# Patient Record
Sex: Female | Born: 1990 | Race: Black or African American | Hispanic: No | Smoking: Former smoker
Health system: Southern US, Community
[De-identification: ages and names within clinical notes are randomized; demographics above are authoritative.]

## PROBLEM LIST (undated history)

## (undated) DIAGNOSIS — F419 Anxiety disorder, unspecified: Secondary | ICD-10-CM

## (undated) DIAGNOSIS — J45909 Unspecified asthma, uncomplicated: Secondary | ICD-10-CM

## (undated) DIAGNOSIS — Z720 Tobacco use: Secondary | ICD-10-CM

## (undated) HISTORY — PX: NO PAST SURGERIES: SHX2092

---
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
## 2012-12-08 ENCOUNTER — Emergency Department: Payer: Self-pay | Admitting: Emergency Medicine

## 2013-05-26 ENCOUNTER — Emergency Department: Payer: Self-pay | Admitting: Emergency Medicine

## 2013-05-27 LAB — LIPASE, BLOOD: Lipase: 61 U/L — ABNORMAL LOW (ref 73–393)

## 2013-05-27 LAB — PREGNANCY, URINE: Pregnancy Test, Urine: NEGATIVE m[IU]/mL

## 2013-05-27 LAB — CBC
HCT: 38.4 % (ref 35.0–47.0)
HGB: 13 g/dL (ref 12.0–16.0)
RBC: 4.41 10*6/uL (ref 3.80–5.20)
WBC: 7.5 10*3/uL (ref 3.6–11.0)

## 2013-05-27 LAB — COMPREHENSIVE METABOLIC PANEL
BUN: 11 mg/dL (ref 7–18)
Calcium, Total: 8.9 mg/dL — ABNORMAL LOW (ref 9.0–10.7)
Chloride: 106 mmol/L (ref 98–107)
EGFR (African American): >60
EGFR (Non-African Amer.): >60
Osmolality: 274 (ref 275–301)
SGOT(AST): 14 U/L (ref 0–26)

## 2013-05-27 LAB — URINALYSIS, COMPLETE
Bilirubin,UR: NEGATIVE
Protein: 30
Specific Gravity: 1.032 (ref 1.003–1.030)
Squamous Epithelial: 15
WBC UR: 65 /HPF (ref 0–5)

## 2013-06-22 ENCOUNTER — Emergency Department: Payer: Self-pay | Admitting: Emergency Medicine

## 2015-02-14 ENCOUNTER — Emergency Department (HOSPITAL_COMMUNITY)
Admission: EM | Admit: 2015-02-14 | Discharge: 2015-02-14 | Disposition: A | Discharge status: Home / Self Care | Payer: Medicaid Other | Attending: Emergency Medicine | Admitting: Emergency Medicine

## 2015-02-14 ENCOUNTER — Encounter (HOSPITAL_COMMUNITY): Payer: Self-pay

## 2015-02-14 ENCOUNTER — Emergency Department (HOSPITAL_COMMUNITY): Payer: Medicaid Other

## 2015-02-14 DIAGNOSIS — Z720 Tobacco use: Secondary | ICD-10-CM | POA: Insufficient documentation

## 2015-02-14 DIAGNOSIS — R109 Unspecified abdominal pain: Secondary | ICD-10-CM

## 2015-02-14 DIAGNOSIS — N898 Other specified noninflammatory disorders of vagina: Secondary | ICD-10-CM | POA: Diagnosis not present

## 2015-02-14 DIAGNOSIS — Z113 Encounter for screening for infections with a predominantly sexual mode of transmission: Secondary | ICD-10-CM | POA: Diagnosis not present

## 2015-02-14 LAB — COMPREHENSIVE METABOLIC PANEL
ALT: 14 U/L (ref 0–35)
ANION GAP: 6 (ref 5–15)
AST: 16 U/L (ref 0–37)
Albumin: 3.9 g/dL (ref 3.5–5.2)
Alkaline Phosphatase: 54 U/L (ref 39–117)
BILIRUBIN TOTAL: 0.3 mg/dL (ref 0.3–1.2)
BUN: 5 mg/dL — AB (ref 6–23)
CALCIUM: 8.9 mg/dL (ref 8.4–10.5)
CO2: 25 mmol/L (ref 19–32)
CREATININE: 0.88 mg/dL (ref 0.50–1.10)
Chloride: 106 mmol/L (ref 96–112)
Glucose, Bld: 101 mg/dL — ABNORMAL HIGH (ref 70–99)
Potassium: 3.6 mmol/L (ref 3.5–5.1)
SODIUM: 137 mmol/L (ref 135–145)
Total Protein: 7.2 g/dL (ref 6.0–8.3)

## 2015-02-14 LAB — CBC WITH DIFFERENTIAL/PLATELET
BASOS PCT: 1 % (ref 0–1)
Basophils Absolute: 0 10*3/uL (ref 0.0–0.1)
EOS ABS: 0 10*3/uL (ref 0.0–0.7)
Eosinophils Relative: 0 % (ref 0–5)
HCT: 39.6 % (ref 36.0–46.0)
HEMOGLOBIN: 13.2 g/dL (ref 12.0–15.0)
Lymphocytes Relative: 24 % (ref 12–46)
Lymphs Abs: 1.3 10*3/uL (ref 0.7–4.0)
MCH: 28.3 pg (ref 26.0–34.0)
MCHC: 33.3 g/dL (ref 30.0–36.0)
MCV: 84.8 fL (ref 78.0–100.0)
MONO ABS: 0.6 10*3/uL (ref 0.1–1.0)
MONOS PCT: 11 % (ref 3–12)
NEUTROS PCT: 64 % (ref 43–77)
Neutro Abs: 3.5 10*3/uL (ref 1.7–7.7)
PLATELETS: 160 10*3/uL (ref 150–400)
RBC: 4.67 MIL/uL (ref 3.87–5.11)
RDW: 13.1 % (ref 11.5–15.5)
WBC: 5.3 10*3/uL (ref 4.0–10.5)

## 2015-02-14 LAB — URINALYSIS, ROUTINE W REFLEX MICROSCOPIC
GLUCOSE, UA: NEGATIVE mg/dL
Hgb urine dipstick: NEGATIVE
Ketones, ur: 15 mg/dL — AB
LEUKOCYTES UA: NEGATIVE
NITRITE: NEGATIVE
PROTEIN: 30 mg/dL — AB
Specific Gravity, Urine: 1.031 — ABNORMAL HIGH (ref 1.005–1.030)
UROBILINOGEN UA: 0.2 mg/dL (ref 0.0–1.0)
pH: 6 (ref 5.0–8.0)

## 2015-02-14 LAB — WET PREP, GENITAL
TRICH WET PREP: NONE SEEN
YEAST WET PREP: NONE SEEN

## 2015-02-14 LAB — URINE MICROSCOPIC-ADD ON

## 2015-02-14 LAB — I-STAT BETA HCG BLOOD, ED (MC, WL, AP ONLY): I-stat hCG, quantitative: <5 m[IU]/mL (ref <5)

## 2015-02-14 MED ORDER — CEFTRIAXONE SODIUM 250 MG IJ SOLR
250.0000 mg | Freq: Once | INTRAMUSCULAR | Status: AC
  Administered 2015-02-14: 250 mg via INTRAMUSCULAR
  Filled 2015-02-14: qty 250

## 2015-02-14 MED ORDER — DOXYCYCLINE HYCLATE 100 MG PO CAPS: 100.0000 mg | ORAL_CAPSULE | Freq: Two times a day (BID) | ORAL | Status: DC

## 2015-02-14 MED ORDER — AZITHROMYCIN 250 MG PO TABS
1000.0000 mg | ORAL_TABLET | Freq: Once | ORAL | Status: AC
  Administered 2015-02-14: 1000 mg via ORAL
  Filled 2015-02-14: qty 4

## 2015-02-14 MED ORDER — STERILE WATER FOR INJECTION IJ SOLN
INTRAMUSCULAR | Status: AC
  Filled 2015-02-14: qty 10

## 2015-02-14 NOTE — ED Notes (Signed)
Pt brought to room, pt undressed, in gown, on continuous pulse oximetry and blood pressure cuff; warm blanket given

## 2015-02-14 NOTE — Discharge Instructions (Signed)
Abdominal Pain, Women °Abdominal (stomach, pelvic, or belly) pain can be caused by many things. It is important to tell your doctor: °· The location of the pain. °· Does it come and go or is it present all the time? °· Are there things that start the pain (eating certain foods, exercise)? °· Are there other symptoms associated with the pain (fever, nausea, vomiting, diarrhea)? °All of this is helpful to know when trying to find the cause of the pain. °CAUSES  °· Stomach: virus or bacteria infection, or ulcer. °· Intestine: appendicitis (inflamed appendix), regional ileitis (Crohn's disease), ulcerative colitis (inflamed colon), irritable bowel syndrome, diverticulitis (inflamed diverticulum of the colon), or cancer of the stomach or intestine. °· Gallbladder disease or stones in the gallbladder. °· Kidney disease, kidney stones, or infection. °· Pancreas infection or cancer. °· Fibromyalgia (pain disorder). °· Diseases of the female organs: °¨ Uterus: fibroid (non-cancerous) tumors or infection. °¨ Fallopian tubes: infection or tubal pregnancy. °¨ Ovary: cysts or tumors. °¨ Pelvic adhesions (scar tissue). °¨ Endometriosis (uterus lining tissue growing in the pelvis and on the pelvic organs). °¨ Pelvic congestion syndrome (female organs filling up with blood just before the menstrual period). °¨ Pain with the menstrual period. °¨ Pain with ovulation (producing an egg). °¨ Pain with an IUD (intrauterine device, birth control) in the uterus. °¨ Cancer of the female organs. °· Functional pain (pain not caused by a disease, may improve without treatment). °· Psychological pain. °· Depression. °DIAGNOSIS  °Your doctor will decide the seriousness of your pain by doing an examination. °· Blood tests. °· X-rays. °· Ultrasound. °· CT scan (computed tomography, special type of X-ray). °· MRI (magnetic resonance imaging). °· Cultures, for infection. °· Barium enema (dye inserted in the large intestine, to better view it with  X-rays). °· Colonoscopy (looking in intestine with a lighted tube). °· Laparoscopy (minor surgery, looking in abdomen with a lighted tube). °· Major abdominal exploratory surgery (looking in abdomen with a large incision). °TREATMENT  °The treatment will depend on the cause of the pain.  °· Many cases can be observed and treated at home. °· Over-the-counter medicines recommended by your caregiver. °· Prescription medicine. °· Antibiotics, for infection. °· Birth control pills, for painful periods or for ovulation pain. °· Hormone treatment, for endometriosis. °· Nerve blocking injections. °· Physical therapy. °· Antidepressants. °· Counseling with a psychologist or psychiatrist. °· Minor or major surgery. °HOME CARE INSTRUCTIONS  °· Do not take laxatives, unless directed by your caregiver. °· Take over-the-counter pain medicine only if ordered by your caregiver. Do not take aspirin because it can cause an upset stomach or bleeding. °· Try a clear liquid diet (broth or water) as ordered by your caregiver. Slowly move to a bland diet, as tolerated, if the pain is related to the stomach or intestine. °· Have a thermometer and take your temperature several times a day, and record it. °· Bed rest and sleep, if it helps the pain. °· Avoid sexual intercourse, if it causes pain. °· Avoid stressful situations. °· Keep your follow-up appointments and tests, as your caregiver orders. °· If the pain does not go away with medicine or surgery, you may try: °¨ Acupuncture. °¨ Relaxation exercises (yoga, meditation). °¨ Group therapy. °¨ Counseling. °SEEK MEDICAL CARE IF:  °· You notice certain foods cause stomach pain. °· Your home care treatment is not helping your pain. °· You need stronger pain medicine. °· You want your IUD removed. °· You feel faint or   lightheaded. °· You develop nausea and vomiting. °· You develop a rash. °· You are having side effects or an allergy to your medicine. °SEEK IMMEDIATE MEDICAL CARE IF:  °· Your  pain does not go away or gets worse. °· You have a fever. °· Your pain is felt only in portions of the abdomen. The right side could possibly be appendicitis. The left lower portion of the abdomen could be colitis or diverticulitis. °· You are passing blood in your stools (bright red or black tarry stools, with or without vomiting). °· You have blood in your urine. °· You develop chills, with or without a fever. °· You pass out. °MAKE SURE YOU:  °· Understand these instructions. °· Will watch your condition. °· Will get help right away if you are not doing well or get worse. °Document Released: 10/06/2007 Document Revised: 04/25/2014 Document Reviewed: 10/26/2009 °[employer]® Patient Information ©2015 [employer]. This information is not intended to replace advice given to you by your health care provider. Make sure you discuss any questions you have with your health care provider. ° °

## 2015-02-14 NOTE — ED Notes (Signed)
Pt here for lower back pain and lower abd pain for the past week. Feels nauseated, no vomiting or diarrhea. Denies any urinary sx or vaginal discharge.

## 2015-02-14 NOTE — ED Notes (Signed)
Patient transported to [facility] 

## 2015-02-14 NOTE — ED Notes (Signed)
Pt ambulatory on discharge. Pt denies any reaction to meds. Pt refused wheelchair. Pt given a soda on discharge

## 2015-02-14 NOTE — ED Notes (Signed)
Patient was given a [place] sandwich bag with a coke with ice.

## 2015-02-14 NOTE — ED Provider Notes (Signed)
CSN: [id number]     Arrival date & time 02/14/15  [id number] History   First MD Initiated Contact with Patient 02/14/15 [phone]     Chief Complaint  Patient presents with  . Abdominal Pain     (Consider location/radiation/quality/duration/timing/severity/associated sxs/prior Treatment) HPI Comments: Pt c/o generalized lower abdominal pain and lower back pain times one week. Denies fever. States that she has had some discharge and urinary frequency. History of std several years ago. No vomiting, diarrhea. Denies history of ovarian cyst. lmp was 2/7. Has been taking tylenol for pain with mild relief. Vaginal discharge is white.  The history is provided by the patient. No language interpreter was used.    History reviewed. No pertinent past medical history. History reviewed. No pertinent past surgical history. History reviewed. No pertinent family history. History  Substance Use Topics  . Smoking status: Current Every Day Smoker  . Smokeless tobacco: Not on file  . Alcohol Use: Yes   OB History    No data available     Review of Systems  All other systems reviewed and are negative.     Allergies  Review of patient's allergies indicates no known allergies.  Home Medications   Prior to Admission medications   Medication Sig Start Date End Date Taking? Authorizing Provider  acetaminophen (TYLENOL) 325 MG tablet Take 650 mg by mouth every 6 (six) hours as needed for mild pain or headache.   Yes Historical Provider, MD   BP 134/95 mmHg  Pulse 105  Temp(Src) 98.6 F (37 C) (Oral)  Resp 18  SpO2 98%  LMP 02/01/2015 Physical Exam  Constitutional: She is oriented to person, place, and time. She appears well-developed and well-nourished.  Cardiovascular: Normal rate and regular rhythm.   Pulmonary/Chest: Effort normal and breath sounds normal.  Abdominal: Soft. Bowel sounds are normal. There is tenderness.  Genitourinary:  Clear jelly like discharge. Bilateral adnexal tednerness   Musculoskeletal: Normal range of motion.  Neurological: She is alert and oriented to person, place, and time.  Skin: Skin is warm and dry.  Psychiatric: She has a normal mood and affect.  Nursing note and vitals reviewed.   ED Course  Procedures (including critical care time) Labs Review Labs Reviewed  WET PREP, GENITAL - Abnormal; Notable for the following:    Clue Cells Wet Prep HPF POC FEW (*)    WBC, Wet Prep HPF POC MANY (*)    All other components within normal limits  COMPREHENSIVE METABOLIC PANEL - Abnormal; Notable for the following:    Glucose, Bld 101 (*)    BUN 5 (*)    All other components within normal limits  URINALYSIS, ROUTINE W REFLEX MICROSCOPIC - Abnormal; Notable for the following:    Color, Urine AMBER (*)    Specific Gravity, Urine 1.031 (*)    Bilirubin Urine SMALL (*)    Ketones, ur 15 (*)    Protein, ur 30 (*)    All other components within normal limits  CBC WITH DIFFERENTIAL/PLATELET  URINE MICROSCOPIC-ADD ON  HIV ANTIBODY (ROUTINE TESTING)  RPR  I-STAT BETA HCG BLOOD, ED ([facility], [facility], [facility] ONLY)  GC/CHLAMYDIA PROBE AMP (Protivin)    Imaging Review [place] Transvaginal Non-ob  02/14/2015   CLINICAL DATA:  Mid pelvic pain for 1 week, possible tubo-ovarian abscess  EXAM: TRANSABDOMINAL AND TRANSVAGINAL ULTRASOUND OF PELVIS  TECHNIQUE: Both transabdominal and transvaginal ultrasound examinations of the pelvis were performed. Transabdominal technique was performed for global imaging of the pelvis including uterus,  ovaries, adnexal regions, and pelvic cul-de-sac. It was necessary to proceed with endovaginal exam following the transabdominal exam to visualize the the ovaries.  COMPARISON:  None  FINDINGS: Uterus  Measurements: 8.7 x 3.6 x 4 cm. No fibroids or other mass visualized.  Endometrium  Thickness: 3.8 mm in thickness within normal limits. No focal abnormality visualized.  Right ovary  Measurements: 4.3 x 1.9 x 1.9 cm. Small follicles are noted. A collapsing  follicle measures 1.3 cm. No adnexal mass.  Left ovary  Measurements: 4.0 x 2 x 2 cm. Normal appearance/no adnexal mass. Normal appearing follicles.  Other findings  No free fluid.  IMPRESSION: 1. Unremarkable uterus.  No pelvic free fluid. 2. No adnexal mass. Normal appearing ovaries. Probable collapsing follicle within right ovary measures 1.3 cm. No pelvic free fluid.   Electronically Signed   By: Natasha Mead M.D.   On: 02/14/2015 10:28   US Pelvis Complete  02/14/2015   CLINICAL DATA:  Mid pelvic pain for 1 week, possible tubo-ovarian abscess  EXAM: TRANSABDOMINAL AND TRANSVAGINAL ULTRASOUND OF PELVIS  TECHNIQUE: Both transabdominal and transvaginal ultrasound examinations of the pelvis were performed. Transabdominal technique was performed for global imaging of the pelvis including uterus, ovaries, adnexal regions, and pelvic cul-de-sac. It was necessary to proceed with endovaginal exam following the transabdominal exam to visualize the the ovaries.  COMPARISON:  None  FINDINGS: Uterus  Measurements: 8.7 x 3.6 x 4 cm. No fibroids or other mass visualized.  Endometrium  Thickness: 3.8 mm in thickness within normal limits. No focal abnormality visualized.  Right ovary  Measurements: 4.3 x 1.9 x 1.9 cm. Small follicles are noted. A collapsing follicle measures 1.3 cm. No adnexal mass.  Left ovary  Measurements: 4.0 x 2 x 2 cm. Normal appearance/no adnexal mass. Normal appearing follicles.  Other findings  No free fluid.  IMPRESSION: 1. Unremarkable uterus.  No pelvic free fluid. 2. No adnexal mass. Normal appearing ovaries. Probable collapsing follicle within right ovary measures 1.3 cm. No pelvic free fluid.   Electronically Signed   By: Natasha Mead M.D.   On: 02/14/2015 10:28     EKG Interpretation None      MDM   Final diagnoses:  Abdominal pain  Vaginal discharge  Screen for STD (sexually transmitted disease)    Pt treated for std based on exam. Discussed return precautions. No sign of toa.  Will send home on doxy. No infection    Teressa Lower, NP 02/14/15 1110  Gerhard Munch, MD 02/14/15 1115

## 2015-02-14 NOTE — ED Notes (Signed)
Pelvic cart set up in room 

## 2015-02-15 LAB — RPR: RPR Ser Ql: NONREACTIVE

## 2015-02-15 LAB — GC/CHLAMYDIA PROBE AMP (~~LOC~~) NOT AT ARMC
CHLAMYDIA, DNA PROBE: NEGATIVE
NEISSERIA GONORRHEA: NEGATIVE

## 2015-02-15 LAB — HIV ANTIBODY (ROUTINE TESTING W REFLEX): HIV Screen 4th Generation wRfx: NONREACTIVE

## 2015-04-05 ENCOUNTER — Emergency Department (HOSPITAL_COMMUNITY)
Admission: EM | Admit: 2015-04-05 | Discharge: 2015-04-05 | Disposition: A | Discharge status: Home / Self Care | Payer: Medicaid Other | Attending: Emergency Medicine | Admitting: Emergency Medicine

## 2015-04-05 ENCOUNTER — Encounter (HOSPITAL_COMMUNITY): Payer: Self-pay | Admitting: *Deleted

## 2015-04-05 ENCOUNTER — Emergency Department (HOSPITAL_COMMUNITY): Payer: Medicaid Other

## 2015-04-05 DIAGNOSIS — Y9389 Activity, other specified: Secondary | ICD-10-CM | POA: Insufficient documentation

## 2015-04-05 DIAGNOSIS — Y929 Unspecified place or not applicable: Secondary | ICD-10-CM | POA: Diagnosis not present

## 2015-04-05 DIAGNOSIS — W07XXXA Fall from chair, initial encounter: Secondary | ICD-10-CM | POA: Diagnosis not present

## 2015-04-05 DIAGNOSIS — S4991XA Unspecified injury of right shoulder and upper arm, initial encounter: Secondary | ICD-10-CM | POA: Diagnosis present

## 2015-04-05 DIAGNOSIS — S40011A Contusion of right shoulder, initial encounter: Secondary | ICD-10-CM | POA: Insufficient documentation

## 2015-04-05 DIAGNOSIS — I10 Essential (primary) hypertension: Secondary | ICD-10-CM | POA: Diagnosis not present

## 2015-04-05 DIAGNOSIS — Y998 Other external cause status: Secondary | ICD-10-CM | POA: Insufficient documentation

## 2015-04-05 DIAGNOSIS — Z720 Tobacco use: Secondary | ICD-10-CM | POA: Insufficient documentation

## 2015-04-05 DIAGNOSIS — Z792 Long term (current) use of antibiotics: Secondary | ICD-10-CM | POA: Diagnosis not present

## 2015-04-05 MED ORDER — DIAZEPAM 5 MG PO TABS
5.0000 mg | ORAL_TABLET | Freq: Once | ORAL | Status: AC
  Administered 2015-04-05: 5 mg via ORAL
  Filled 2015-04-05: qty 1

## 2015-04-05 MED ORDER — DICLOFENAC SODIUM 1 % TD GEL: 2.0000 g | Freq: Four times a day (QID) | TRANSDERMAL | Status: DC

## 2015-04-05 MED ORDER — OXYCODONE-ACETAMINOPHEN 5-325 MG PO TABS
2.0000 | ORAL_TABLET | Freq: Once | ORAL | Status: AC
  Administered 2015-04-05: 2 via ORAL
  Filled 2015-04-05: qty 2

## 2015-04-05 MED ORDER — OXYCODONE-ACETAMINOPHEN 5-325 MG PO TABS: 1.0000 | ORAL_TABLET | Freq: Four times a day (QID) | ORAL | Status: DC | PRN

## 2015-04-05 MED ORDER — DIAZEPAM 5 MG PO TABS
5.0000 mg | ORAL_TABLET | Freq: Two times a day (BID) | ORAL | Status: DC
Start: 2015-04-05 — End: 2022-03-05

## 2015-04-05 NOTE — ED Notes (Signed)
The pt is c/o rt shoulder pain.  She fell last night and did not have a way here.  She has pain over her rt a-c joint painful when moving her rt arm and she cannot lift the arm very high.  lmp last week

## 2015-04-05 NOTE — Discharge Instructions (Signed)
Recommend that you alternate ice and heat 3-4 times per day. YOu should also stretch your shoulder out 3-4 times per day. Apply Voltaren gel to your shoulder as prescribed and take Valium for spasm. You may take Percocet as needed for severe pain. Follow-up with orthopedics if symptoms persist.  Rotator Cuff Injury Rotator cuff injury is any type of injury to the set of muscles and tendons that make up the stabilizing unit of your shoulder. This unit holds the ball of your upper arm bone (humerus) in the socket of your shoulder blade (scapula).  CAUSES Injuries to your rotator cuff most commonly come from sports or activities that cause your arm to be moved repeatedly over your head. Examples of this include throwing, weight lifting, swimming, or racquet sports. Long lasting (chronic) irritation of your rotator cuff can cause soreness and swelling (inflammation), bursitis, and eventual damage to your tendons, such as a tear (rupture). SIGNS AND SYMPTOMS Acute rotator cuff tear:  Sudden tearing sensation followed by severe pain shooting from your upper shoulder down your arm toward your elbow.  Decreased range of motion of your shoulder because of pain and muscle spasm.  Severe pain.  Inability to raise your arm out to the side because of pain and loss of muscle power (large tears). Chronic rotator cuff tear:  Pain that usually is worse at night and may interfere with sleep.  Gradual weakness and decreased shoulder motion as the pain worsens.  Decreased range of motion. Rotator cuff tendinitis:  Deep ache in your shoulder and the outside upper arm over your shoulder.  Pain that comes on gradually and becomes worse when lifting your arm to the side or turning it inward. DIAGNOSIS Rotator cuff injury is diagnosed through a medical history, physical exam, and imaging exam. The medical history helps determine the type of rotator cuff injury. Your health care provider will look at your  injured shoulder, feel the injured area, and ask you to move your shoulder in different positions. X-ray exams typically are done to rule out other causes of shoulder pain, such as fractures. MRI is the exam of choice for the most severe shoulder injuries because the images show muscles and tendons.  TREATMENT  Chronic tear:  Medicine for pain, such as acetaminophen or ibuprofen.  Physical therapy and range-of-motion exercises may be helpful in maintaining shoulder function and strength.  Steroid injections into your shoulder joint.  Surgical repair of the rotator cuff if the injury does not heal with noninvasive treatment. Acute tear:  Anti-inflammatory medicines such as ibuprofen and naproxen to help reduce pain and swelling.  A sling to help support your arm and rest your rotator cuff muscles. Long-term use of a sling is not advised. It may cause significant stiffening of the shoulder joint.  Surgery may be considered within a few weeks, especially in younger, active people, to return the shoulder to full function.  Indications for surgical treatment include the following:  Age younger than 60 years.  Rotator cuff tears that are complete.  Physical therapy, rest, and anti-inflammatory medicines have been used for 6-8 weeks, with no improvement.  Employment or sporting activity that requires constant shoulder use. Tendinitis:  Anti-inflammatory medicines such as ibuprofen and naproxen to help reduce pain and swelling.  A sling to help support your arm and rest your rotator cuff muscles. Long-term use of a sling is not advised. It may cause significant stiffening of the shoulder joint.  Severe tendinitis may require:  Steroid injections into  your shoulder joint.  Physical therapy.  Surgery. HOME CARE INSTRUCTIONS   Apply ice to your injury:  Put ice in a plastic bag.  Place a towel between your skin and the bag.  Leave the ice on for 20 minutes, 2-3 times a  day.  If you have a shoulder immobilizer (sling and straps), wear it until told otherwise by your health care provider.  You may want to sleep on several pillows or in a recliner at night to lessen swelling and pain.  Only take over-the-counter or prescription medicines for pain, discomfort, or fever as directed by your health care provider.  Do simple hand squeezing exercises with a soft rubber ball to decrease hand swelling. SEEK MEDICAL CARE IF:   Your shoulder pain increases, or new pain or numbness develops in your arm, hand, or fingers.  Your hand or fingers are colder than your other hand. SEEK IMMEDIATE MEDICAL CARE IF:   Your arm, hand, or fingers are numb or tingling.  Your arm, hand, or fingers are increasingly swollen and painful, or they turn white or blue. MAKE SURE YOU:  Understand these instructions.  Will watch your condition.  Will get help right away if you are not doing well or get worse. Document Released: 12/06/2000 Document Revised: 12/14/2013 Document Reviewed: 07/21/2013 [facility] Patient Information 2015 [place], [place]. This information is not intended to replace advice given to you by your health care provider. Make sure you discuss any questions you have with your health care provider.

## 2015-04-05 NOTE — ED Provider Notes (Signed)
CSN: [id number]     Arrival date & time 04/05/15  2009 History   First MD Initiated Contact with Patient 04/05/15 2032     Chief Complaint  Patient presents with  . Fall    (Consider location/radiation/quality/duration/timing/severity/associated sxs/prior Treatment) HPI Comments: Patient is a 24 year old female witha past medical history who presents to the emergency department for further evaluation of right shoulder pain. Patient states that she was standing on a chair trying to change a light bulb when she lost her balance and fell on her right shoulder. She states that she has been having constant pain to the superior aspect of her shoulder which is worse with movement. She denies taking any medications for her symptoms or applying ice. She denies any loss of sensation in her right arm. No shortness of breath. Patient denies head trauma or loss of consciousness. No history of prior injury to her right shoulder.  Patient is a 24 y.o. female presenting with fall. The history is provided by the patient. No language interpreter was used.  Fall Associated symptoms include arthralgias and myalgias.    Past Medical History  Diagnosis Date  . Hypertension    History reviewed. No pertinent past surgical history. No family history on file. History  Substance Use Topics  . Smoking status: Current Every Day Smoker  . Smokeless tobacco: Not on file  . Alcohol Use: Yes   OB History    No data available      Review of Systems  Musculoskeletal: Positive for myalgias and arthralgias.  All other systems reviewed and are negative.   Allergies  Review of patient's allergies indicates no known allergies.  Home Medications   Prior to Admission medications   Medication Sig Start Date End Date Taking? Authorizing Provider  acetaminophen (TYLENOL) 325 MG tablet Take 650 mg by mouth every 6 (six) hours as needed for mild pain or headache.    Historical Provider, MD  diazepam (VALIUM) 5 MG  tablet Take 1 tablet (5 mg total) by mouth 2 (two) times daily. 04/05/15   Antony Madura, PA-C  diclofenac sodium (VOLTAREN) 1 % GEL Apply 2 g topically 4 (four) times daily. 04/05/15   Antony Madura, PA-C  doxycycline (VIBRAMYCIN) 100 MG capsule Take 1 capsule (100 mg total) by mouth 2 (two) times daily. 02/14/15   Teressa Lower, NP  oxyCODONE-acetaminophen (PERCOCET/ROXICET) 5-325 MG per tablet Take 1-2 tablets by mouth every 6 (six) hours as needed for severe pain. 04/05/15   Antony Madura, PA-C   BP 113/84 mmHg  Pulse 73  Temp(Src) 97.7 F (36.5 C) (Oral)  Resp 14  Ht  (1.549 m)  Wt 147 lb (66.679 kg)  BMI 27.79 kg/m2  SpO2 100%  LMP 03/05/2015   Physical Exam  Constitutional: She is oriented to person, place, and time. She appears well-developed and well-nourished. No distress.  Nontoxic/nonseptic appearing  HENT:  Head: Normocephalic and atraumatic.  Eyes: Conjunctivae and EOM are normal. No scleral icterus.  Neck: Normal range of motion.  Cardiovascular: Normal rate, regular rhythm and intact distal pulses.   Distal radial pulse 2+ in the right upper extremity  Pulmonary/Chest: Effort normal. No respiratory distress.  Musculoskeletal: She exhibits tenderness.       Right shoulder: She exhibits decreased range of motion, tenderness and pain. She exhibits no bony tenderness, no swelling, no effusion, no crepitus, no deformity, no spasm and normal pulse.       Arms: TTP to superior R shoulder and the right [facility]  joint. Decreased AROM appreciated to be secondary to pain. Strength against resistance 4/5, also likely due to pain. No bony deformity or crepitus. No evidence of dislocation.  Neurological: She is alert and oriented to person, place, and time. She exhibits normal muscle tone. Coordination normal.  GCS 15. Sensation to light touch intact. Good grip strength.  Skin: Skin is warm and dry. No rash noted. She is not diaphoretic. No erythema. No pallor.  Psychiatric: She has a  normal mood and affect. Her behavior is normal.  Nursing note and vitals reviewed.   ED Course  Procedures (including critical care time) Labs Review Labs Reviewed - No data to display  Imaging Review Dg Shoulder Right  04/05/2015   CLINICAL DATA:  Pain following fall  EXAM: RIGHT SHOULDER - 2+ VIEW  COMPARISON:  None.  FINDINGS: Frontal, Y scapular, and axillary images were obtained. There is no fracture or dislocation. Joint spaces appear intact. No erosive change.  IMPRESSION: No fracture or dislocation.  No appreciable arthropathy.   Electronically Signed   By: Bretta BangWilliam  Woodruff III M.D.   On: 04/05/2015 21:08     EKG Interpretation None      MDM   Final diagnoses:  Shoulder contusion, right, initial encounter    24 year old female percent to the emergency department for further evaluation of right shoulder pain. Workup and physical exam consistent with contusion to the right shoulder. X-ray negative for fracture, dislocation, or deformity. Patient given a sling for comfort. Have advised frequent stretching as well as these of NSAIDs and muscle relaxers. Orthopedic referral given should symptoms persist or worsen. Patient agreeable to plan with no unaddressed concerns. Patient discharged in good condition.   Filed Vitals:   04/05/15 2014 04/05/15 2126  BP: 123/77 113/84  Pulse: 81 73  Temp: 98.6 F (37 C) 97.7 F (36.5 C)  TempSrc:  Oral  Resp: 16 14  Height: 5\' 1"  (1.549 m)   Weight: 147 lb (66.679 kg)   SpO2: 98% 100%       Antony MaduraKelly Alexandre Lightsey, PA-C 04/05/15 2333  Cathren LaineKevin Steinl, MD 04/05/15 [phone]

## 2015-04-06 ENCOUNTER — Encounter (HOSPITAL_BASED_OUTPATIENT_CLINIC_OR_DEPARTMENT_OTHER): Payer: Self-pay | Admitting: Emergency Medicine

## 2015-06-18 ENCOUNTER — Encounter (HOSPITAL_COMMUNITY): Payer: Self-pay | Admitting: Emergency Medicine

## 2015-06-18 ENCOUNTER — Emergency Department (HOSPITAL_COMMUNITY)
Admission: EM | Admit: 2015-06-18 | Discharge: 2015-06-18 | Disposition: A | Discharge status: Home / Self Care | Payer: Self-pay | Attending: Emergency Medicine | Admitting: Emergency Medicine

## 2015-06-18 ENCOUNTER — Emergency Department (HOSPITAL_COMMUNITY): Payer: Self-pay

## 2015-06-18 ENCOUNTER — Emergency Department (HOSPITAL_COMMUNITY): Payer: Medicaid Other

## 2015-06-18 DIAGNOSIS — Z3202 Encounter for pregnancy test, result negative: Secondary | ICD-10-CM | POA: Insufficient documentation

## 2015-06-18 DIAGNOSIS — Z79899 Other long term (current) drug therapy: Secondary | ICD-10-CM | POA: Insufficient documentation

## 2015-06-18 DIAGNOSIS — Z792 Long term (current) use of antibiotics: Secondary | ICD-10-CM | POA: Insufficient documentation

## 2015-06-18 DIAGNOSIS — I10 Essential (primary) hypertension: Secondary | ICD-10-CM | POA: Insufficient documentation

## 2015-06-18 DIAGNOSIS — Z791 Long term (current) use of non-steroidal anti-inflammatories (NSAID): Secondary | ICD-10-CM | POA: Insufficient documentation

## 2015-06-18 DIAGNOSIS — B353 Tinea pedis: Secondary | ICD-10-CM | POA: Insufficient documentation

## 2015-06-18 DIAGNOSIS — Z720 Tobacco use: Secondary | ICD-10-CM | POA: Insufficient documentation

## 2015-06-18 DIAGNOSIS — R079 Chest pain, unspecified: Secondary | ICD-10-CM | POA: Insufficient documentation

## 2015-06-18 LAB — RAPID URINE DRUG SCREEN, HOSP PERFORMED
AMPHETAMINES: NOT DETECTED
Barbiturates: NOT DETECTED
Benzodiazepines: POSITIVE — AB
Cocaine: NOT DETECTED
OPIATES: NOT DETECTED
Tetrahydrocannabinol: POSITIVE — AB

## 2015-06-18 LAB — CBC WITH DIFFERENTIAL/PLATELET
Basophils Absolute: 0 10*3/uL (ref 0.0–0.1)
Basophils Relative: 0 % (ref 0–1)
Eosinophils Absolute: 0 10*3/uL (ref 0.0–0.7)
Eosinophils Relative: 0 % (ref 0–5)
HCT: 39.4 % (ref 36.0–46.0)
HEMOGLOBIN: 13.6 g/dL (ref 12.0–15.0)
LYMPHS ABS: 1.8 10*3/uL (ref 0.7–4.0)
Lymphocytes Relative: 12 % (ref 12–46)
MCH: 29.4 pg (ref 26.0–34.0)
MCHC: 34.5 g/dL (ref 30.0–36.0)
MCV: 85.1 fL (ref 78.0–100.0)
MONO ABS: 1 10*3/uL (ref 0.1–1.0)
Monocytes Relative: 7 % (ref 3–12)
Neutro Abs: 12.3 10*3/uL — ABNORMAL HIGH (ref 1.7–7.7)
Neutrophils Relative %: 81 % — ABNORMAL HIGH (ref 43–77)
PLATELETS: 248 10*3/uL (ref 150–400)
RBC: 4.63 MIL/uL (ref 3.87–5.11)
RDW: 13.5 % (ref 11.5–15.5)
WBC: 15.2 10*3/uL — AB (ref 4.0–10.5)

## 2015-06-18 LAB — I-STAT CHEM 8, ED
BUN: 4 mg/dL — ABNORMAL LOW (ref 6–20)
Calcium, Ion: 1.17 mmol/L (ref 1.12–1.23)
Chloride: 101 mmol/L (ref 101–111)
Creatinine, Ser: 0.7 mg/dL (ref 0.44–1.00)
Glucose, Bld: 105 mg/dL — ABNORMAL HIGH (ref 65–99)
HEMATOCRIT: 46 % (ref 36.0–46.0)
HEMOGLOBIN: 15.6 g/dL — AB (ref 12.0–15.0)
Potassium: 3.8 mmol/L (ref 3.5–5.1)
Sodium: 135 mmol/L (ref 135–145)
TCO2: 22 mmol/L (ref 0–100)

## 2015-06-18 LAB — URINALYSIS, ROUTINE W REFLEX MICROSCOPIC
Bilirubin Urine: NEGATIVE
Glucose, UA: NEGATIVE mg/dL
Hgb urine dipstick: NEGATIVE
KETONES UR: NEGATIVE mg/dL
LEUKOCYTES UA: NEGATIVE
NITRITE: NEGATIVE
PH: 5.5 (ref 5.0–8.0)
PROTEIN: NEGATIVE mg/dL
Specific Gravity, Urine: >1.03 — ABNORMAL HIGH (ref 1.005–1.030)
Urobilinogen, UA: 0.2 mg/dL (ref 0.0–1.0)

## 2015-06-18 LAB — I-STAT TROPONIN, ED: TROPONIN I, POC: 0 ng/mL (ref 0.00–0.08)

## 2015-06-18 LAB — PREGNANCY, URINE: PREG TEST UR: NEGATIVE

## 2015-06-18 LAB — D-DIMER, QUANTITATIVE (NOT AT ARMC): D DIMER QUANT: 1.42 ug{FEU}/mL — AB (ref 0.00–0.48)

## 2015-06-18 MED ORDER — CLOTRIMAZOLE-BETAMETHASONE 1-0.05 % EX CREA: TOPICAL_CREAM | CUTANEOUS | Status: DC

## 2015-06-18 MED ORDER — IOHEXOL 350 MG/ML SOLN
80.0000 mL | Freq: Once | INTRAVENOUS | Status: AC | PRN
  Administered 2015-06-18: 80 mL via INTRAVENOUS

## 2015-06-18 MED ORDER — IBUPROFEN 400 MG PO TABS: 400.0000 mg | ORAL_TABLET | Freq: Three times a day (TID) | ORAL | Status: DC

## 2015-06-18 MED ORDER — KETOROLAC TROMETHAMINE 30 MG/ML IJ SOLN
30.0000 mg | Freq: Once | INTRAMUSCULAR | Status: AC
  Administered 2015-06-18: 30 mg via INTRAVENOUS
  Filled 2015-06-18: qty 1

## 2015-06-18 NOTE — ED Notes (Signed)
Pt reports chest pain x2 days; reports pain on inspiration and "it hurts to breath"; states she cannot lie flat; hx of cocaine abuse. Denies use of cocaine in the last week. Pt a/o x4.

## 2015-06-18 NOTE — ED Provider Notes (Signed)
CSN: [id number]     Arrival date & time 06/18/15  0801 History   First MD Initiated Contact with Patient 06/18/15 0818     Chief Complaint  Patient presents with  . Chest Pain     (Consider location/radiation/quality/duration/timing/severity/associated sxs/prior Treatment) Patient is a 24 y.o. female presenting with chest pain. The history is provided by the patient.  Chest Pain Associated symptoms: shortness of breath   Associated symptoms: no abdominal pain, no back pain, no headache, no nausea, no numbness, not vomiting and no weakness    patient has had shortness of breath for the last 2 days. Also some dull chest pain in her mid chest. States she has lived in the place that had a lot of mold and she thinks that was causing this. States is worse for her to lay down to. She is a smoker. She also has a substance abuser and recently relapsed on cocaine. Reportedly last used several days ago. No fevers. She states she cannot cough. When asked what this means she says nothing can come up with it. No swelling or legs. No known cardiac history. She is 24 years old. No family history of early cardiac disease. She is not injected drugs in the past.  Past Medical History  Diagnosis Date  . Hypertension    History reviewed. No pertinent past surgical history. No family history on file. History  Substance Use Topics  . Smoking status: Current Every Day Smoker -- 0.50 packs/day    Types: Cigarettes  . Smokeless tobacco: Not on file  . Alcohol Use: Yes   OB History    No data available     Review of Systems  Constitutional: Negative for activity change and appetite change.  Eyes: Negative for pain.  Respiratory: Positive for shortness of breath. Negative for chest tightness.   Cardiovascular: Positive for chest pain. Negative for leg swelling.  Gastrointestinal: Negative for nausea, vomiting, abdominal pain and diarrhea.  Genitourinary: Negative for flank pain.  Musculoskeletal:  Negative for back pain and neck stiffness.  Skin: Negative for rash.  Neurological: Negative for weakness, numbness and headaches.  Psychiatric/Behavioral: Negative for behavioral problems.      Allergies  Review of patient's allergies indicates no known allergies.  Home Medications   Prior to Admission medications   Medication Sig Start Date End Date Taking? Authorizing Provider  acetaminophen (TYLENOL) 325 MG tablet Take 650 mg by mouth every 6 (six) hours as needed for mild pain or headache.    Historical Provider, MD  diazepam (VALIUM) 5 MG tablet Take 1 tablet (5 mg total) by mouth 2 (two) times daily. 04/05/15   Antony Madura, PA-C  diclofenac sodium (VOLTAREN) 1 % GEL Apply 2 g topically 4 (four) times daily. 04/05/15   Antony Madura, PA-C  doxycycline (VIBRAMYCIN) 100 MG capsule Take 1 capsule (100 mg total) by mouth 2 (two) times daily. 02/14/15   Teressa Lower, NP  oxyCODONE-acetaminophen (PERCOCET/ROXICET) 5-325 MG per tablet Take 1-2 tablets by mouth every 6 (six) hours as needed for severe pain. 04/05/15   Antony Madura, PA-C   BP 102/60 mmHg  Pulse 93  Temp(Src) 98.1 F (36.7 C) (Oral)  Resp 16  SpO2 98%  LMP 05/17/2015 (Approximate) Physical Exam  Constitutional: She is oriented to person, place, and time. She appears well-developed and well-nourished.  HENT:  Head: Normocephalic and atraumatic.  Neck: Normal range of motion. Neck supple.  Cardiovascular: Normal rate, regular rhythm and normal heart sounds.   No murmur heard.  Pulmonary/Chest: Effort normal and breath sounds normal. No respiratory distress. She has no wheezes. She has no rales. She exhibits tenderness.  Mild tenderness anterior mid chest wall.  Abdominal: Soft. Bowel sounds are normal. She exhibits no distension. There is no tenderness. There is no rebound and no guarding.  Musculoskeletal: Normal range of motion. She exhibits no edema.  Neurological: She is alert and oriented to person, place, and time.  No cranial nerve deficit.  Skin: Skin is warm and dry.  Psychiatric: Her speech is normal.  Patient is anxious  Nursing note and vitals reviewed.   ED Course  Procedures (including critical care time) Labs Review Labs Reviewed  D-DIMER, QUANTITATIVE (NOT AT [facility])  CBC WITH DIFFERENTIAL/PLATELET  PREGNANCY, URINE  URINE RAPID DRUG SCREEN, HOSP PERFORMED  URINALYSIS, ROUTINE W REFLEX MICROSCOPIC (NOT AT [facility])  I-STAT CHEM 8, ED  I-STAT TROPOININ, ED    Imaging Review No results found.   EKG Interpretation   Date/Time:  Sunday June 18 2015 08:09:27 EDT Ventricular Rate:  91 PR Interval:  216 QRS Duration: 84 QT Interval:  346 QTC Calculation: 425 R Axis:   79 Text Interpretation:  Sinus rhythm with 1st degree A-V block ST elevation  consider anterolateral injury or acute infarct ST elevation consider  inferior injury or acute infarct Confirmed by Ramie Palladino  MD, Heywood Tokunaga  [phone]) on 06/18/2015 8:14:21 AM      MDM   Final diagnoses:  None   patient with chest pain. Pleuritic and elevated d-dimer. Negative Doppler. Does have somewhat abnormal EKG without an old to compare to. Consideration also pericarditis. Was started on NSAIDs will discharge home. Upon discharge patient states she had some bad fungus on her feet. Between the third and fourth and fourth and fifth toes on the right side there was possible fungal infection with some sloughing of skin. Will give and I fungal. Patient will need to follow-up with PCP or cardiologist.    Benjiman Core, MD 06/18/15 [phone]

## 2015-06-18 NOTE — ED Notes (Signed)
Pt transporting to CT Angio at current time.

## 2015-06-18 NOTE — ED Notes (Signed)
Pt requesting pain medication.  

## 2015-06-18 NOTE — ED Notes (Signed)
Patient transported to X-ray 

## 2015-06-18 NOTE — Discharge Instructions (Signed)
Chest Pain (Nonspecific) °It is often hard to give a specific diagnosis for the cause of chest pain. There is always a chance that your pain could be related to something serious, such as a heart attack or a blood clot in the lungs. You need to follow up with your health care provider for further evaluation. °CAUSES  °· Heartburn. °· Pneumonia or bronchitis. °· Anxiety or stress. °· Inflammation around your heart (pericarditis) or lung (pleuritis or pleurisy). °· A blood clot in the lung. °· A collapsed lung (pneumothorax). It can develop suddenly on its own (spontaneous pneumothorax) or from trauma to the chest. °· Shingles infection (herpes zoster virus). °The chest wall is composed of bones, muscles, and cartilage. Any of these can be the source of the pain. °· The bones can be bruised by injury. °· The muscles or cartilage can be strained by coughing or overwork. °· The cartilage can be affected by inflammation and become sore (costochondritis). °DIAGNOSIS  °Lab tests or other studies may be needed to find the cause of your pain. Your health care provider may have you take a test called an ambulatory electrocardiogram (ECG). An ECG records your heartbeat patterns over a 24-hour period. You may also have other tests, such as: °· Transthoracic echocardiogram (TTE). During echocardiography, sound waves are used to evaluate how blood flows through your heart. °· Transesophageal echocardiogram (TEE). °· Cardiac monitoring. This allows your health care provider to monitor your heart rate and rhythm in real time. °· Holter monitor. This is a portable device that records your heartbeat and can help diagnose heart arrhythmias. It allows your health care provider to track your heart activity for several days, if needed. °· Stress tests by exercise or by giving medicine that makes the heart beat faster. °TREATMENT  °· Treatment depends on what may be causing your chest pain. Treatment may include: °· Acid blockers for  heartburn. °· Anti-inflammatory medicine. °· Pain medicine for inflammatory conditions. °· Antibiotics if an infection is present. °· You may be advised to change lifestyle habits. This includes stopping smoking and avoiding alcohol, caffeine, and chocolate. °· You may be advised to keep your head raised (elevated) when sleeping. This reduces the chance of acid going backward from your stomach into your esophagus. °Most of the time, nonspecific chest pain will improve within 2-3 days with rest and mild pain medicine.  °HOME CARE INSTRUCTIONS  °· If antibiotics were prescribed, take them as directed. Finish them even if you start to feel better. °· For the next few days, avoid physical activities that bring on chest pain. Continue physical activities as directed. °· Do not use any tobacco products, including cigarettes, chewing tobacco, or electronic cigarettes. °· Avoid drinking alcohol. °· Only take medicine as directed by your health care provider. °· Follow your health care provider's suggestions for further testing if your chest pain does not go away. °· Keep any follow-up appointments you made. If you do not go to an appointment, you could develop lasting (chronic) problems with pain. If there is any problem keeping an appointment, call to reschedule. °SEEK MEDICAL CARE IF:  °· Your chest pain does not go away, even after treatment. °· You have a rash with blisters on your chest. °· You have a fever. °SEEK IMMEDIATE MEDICAL CARE IF:  °· You have increased chest pain or pain that spreads to your arm, neck, jaw, back, or abdomen. °· You have shortness of breath. °· You have an increasing cough, or you cough   up blood.  You have severe back or abdominal pain.  You feel nauseous or vomit.  You have severe weakness.  You faint.  You have chills. This is an emergency. Do not wait to see if the pain will go away. Get medical help at once. Call your local emergency services (911 in [place] Do not drive  yourself to the hospital. MAKE SURE YOU:   Understand these instructions.  Will watch your condition.  Will get help right away if you are not doing well or get worse. Document Released: 09/18/2005 Document Revised: 12/14/2013 Document Reviewed: 07/14/2008 [facility] Patient Information 2015 [place], [place]. This information is not intended to replace advice given to you by your health care provider. Make sure you discuss any questions you have with your health care provider.  Athlete's Foot Athlete's foot (tinea pedis) is a fungal infection of the skin on the feet. It often occurs on the skin between the toes or underneath the toes. It can also occur on the soles of the feet. Athlete's foot is more likely to occur in hot, humid weather. Not washing your feet or changing your socks often enough can contribute to athlete's foot. The infection can spread from person to person (contagious). CAUSES Athlete's foot is caused by a fungus. This fungus thrives in warm, moist places. Most people get athlete's foot by sharing shower stalls, towels, and wet floors with an infected person. People with weakened immune systems, including those with diabetes, may be more likely to get athlete's foot. SYMPTOMS   Itchy areas between the toes or on the soles of the feet.  White, flaky, or scaly areas between the toes or on the soles of the feet.  Tiny, intensely itchy blisters between the toes or on the soles of the feet.  Tiny cuts on the skin. These cuts can develop a bacterial infection.  Thick or discolored toenails. DIAGNOSIS  Your caregiver can usually tell what the problem is by doing a physical exam. Your caregiver may also take a skin sample from the rash area. The skin sample may be examined under a microscope, or it may be tested to see if fungus will grow in the sample. A sample may also be taken from your toenail for testing. TREATMENT  Over-the-counter and prescription medicines can be used to  kill the fungus. These medicines are available as powders or creams. Your caregiver can suggest medicines for you. Fungal infections respond slowly to treatment. You may need to continue using your medicine for several weeks. PREVENTION   Do not share towels.  Wear sandals in wet areas, such as shared locker rooms and shared showers.  Keep your feet dry. Wear shoes that allow air to circulate. Wear cotton or wool socks. HOME CARE INSTRUCTIONS   Take medicines as directed by your caregiver. Do not use steroid creams on athlete's foot.  Keep your feet clean and cool. Wash your feet daily and dry them thoroughly, especially between your toes.  Change your socks every day. Wear cotton or wool socks. In hot climates, you may need to change your socks 2 to 3 times per day.  Wear sandals or canvas tennis shoes with good air circulation.  If you have blisters, soak your feet in Burow's solution or Epsom salts for 20 to 30 minutes, 2 times a day to dry out the blisters. Make sure you dry your feet thoroughly afterward. SEEK MEDICAL CARE IF:   You have a fever.  You have swelling, soreness, warmth, or  redness in your foot.  You are not getting better after 7 days of treatment.  You are not completely cured after 30 days.  You have any problems caused by your medicines. MAKE SURE YOU:   Understand these instructions.  Will watch your condition.  Will get help right away if you are not doing well or get worse. Document Released: 12/06/2000 Document Revised: 03/02/2012 Document Reviewed: 09/27/2011 [facility] Patient Information 2015 [place], [place]. This information is not intended to replace advice given to you by your health care provider. Make sure you discuss any questions you have with your health care provider.

## 2015-06-18 NOTE — ED Notes (Signed)
Pt returned from CT and placed back on monitor.

## 2015-06-18 NOTE — ED Notes (Signed)
Pt has had multiple requests, including fungal cream, cough medicine for her son and others. MDs notified.

## 2015-07-05 ENCOUNTER — Encounter (HOSPITAL_COMMUNITY): Payer: Self-pay | Admitting: Emergency Medicine

## 2015-07-05 ENCOUNTER — Emergency Department (HOSPITAL_COMMUNITY)
Admission: EM | Admit: 2015-07-05 | Discharge: 2015-07-05 | Disposition: A | Discharge status: Home / Self Care | Payer: Medicaid Other | Attending: Emergency Medicine | Admitting: Emergency Medicine

## 2015-07-05 DIAGNOSIS — Y999 Unspecified external cause status: Secondary | ICD-10-CM | POA: Insufficient documentation

## 2015-07-05 DIAGNOSIS — S168XXA Other specified injury of muscle, fascia and tendon at neck level, initial encounter: Secondary | ICD-10-CM | POA: Diagnosis not present

## 2015-07-05 DIAGNOSIS — J029 Acute pharyngitis, unspecified: Secondary | ICD-10-CM | POA: Diagnosis not present

## 2015-07-05 DIAGNOSIS — Y92410 Unspecified street and highway as the place of occurrence of the external cause: Secondary | ICD-10-CM | POA: Diagnosis not present

## 2015-07-05 DIAGNOSIS — Z8659 Personal history of other mental and behavioral disorders: Secondary | ICD-10-CM | POA: Diagnosis not present

## 2015-07-05 DIAGNOSIS — I10 Essential (primary) hypertension: Secondary | ICD-10-CM | POA: Insufficient documentation

## 2015-07-05 DIAGNOSIS — Z720 Tobacco use: Secondary | ICD-10-CM | POA: Diagnosis not present

## 2015-07-05 DIAGNOSIS — Y939 Activity, unspecified: Secondary | ICD-10-CM | POA: Insufficient documentation

## 2015-07-05 DIAGNOSIS — M7912 Myalgia of auxiliary muscles, head and neck: Secondary | ICD-10-CM

## 2015-07-05 HISTORY — DX: Anxiety disorder, unspecified: F41.9

## 2015-07-05 MED ORDER — NAPROXEN 500 MG PO TABS
500.0000 mg | ORAL_TABLET | Freq: Once | ORAL | Status: AC
  Administered 2015-07-05: 500 mg via ORAL
  Filled 2015-07-05: qty 1

## 2015-07-05 MED ORDER — NAPROXEN 500 MG PO TABS: 500.0000 mg | ORAL_TABLET | Freq: Two times a day (BID) | ORAL | Status: DC | PRN
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
## 2015-07-05 NOTE — Discharge Instructions (Signed)
Take naprosyn as directed for inflammation and pain with tylenol for breakthrough pain. Use heat or ice to areas of soreness, no more than 20 minutes at a time every hour for each. Use hot tea or cold liquids to help soothe your throat. Expect to be sore for the next few days and follow up with primary care physician for recheck of ongoing symptoms in the next 1 week. Return to ER for emergent changing or worsening of symptoms.     Sore Throat A sore throat is a painful, burning, sore, or scratchy feeling of the throat. There may be pain or tenderness when swallowing or talking. You may have other symptoms with a sore throat. These include coughing, sneezing, fever, or a swollen neck. A sore throat is often the first sign of another sickness. These sicknesses may include a cold, flu, strep throat, or an infection called mono. Most sore throats go away without medical treatment.  HOME CARE   Only take medicine as told by your doctor.  Drink enough fluids to keep your pee (urine) clear or pale yellow.  Rest as needed.  Try using throat sprays, lozenges, or suck on hard candy (if older than 4 years or as told).  Sip warm liquids, such as broth, herbal tea, or warm water with honey. Try sucking on frozen ice pops or drinking cold liquids.  Rinse the mouth (gargle) with salt water. Mix 1 teaspoon salt with 8 ounces of water.  Do not smoke. Avoid being around others when they are smoking.  Put a humidifier in your bedroom at night to moisten the air. You can also turn on a hot shower and sit in the bathroom for 5-10 minutes. Be sure the bathroom door is closed. GET HELP RIGHT AWAY IF:   You have trouble breathing.  You cannot swallow fluids, soft foods, or your spit (saliva).  You have more puffiness (swelling) in the throat.  Your sore throat does not get better in 7 days.  You feel sick to your stomach (nauseous) and throw up (vomit).  You have a fever or lasting symptoms for more than  2-3 days.  You have a fever and your symptoms suddenly get worse. MAKE SURE YOU:   Understand these instructions.  Will watch your condition.  Will get help right away if you are not doing well or get worse. Document Released: 09/17/2008 Document Revised: 09/02/2012 Document Reviewed: 08/16/2012 [facility] Patient Information 2015 [place]. This information is not intended to replace advice given to you by your health care provider. Make sure you discuss any questions you have with your health care provider.  Muscle Pain Muscle pain (myalgia) may be caused by many things, including:  Overuse or muscle strain, especially if you are not in shape. This is the most common cause of muscle pain.  Injury.  Bruises.  Viruses, such as the flu.  Infectious diseases.  Fibromyalgia, which is a chronic condition that causes muscle tenderness, fatigue, and headache.  Autoimmune diseases, including lupus.  Certain drugs, including ACE inhibitors and statins. Muscle pain may be mild or severe. In most cases, the pain lasts only a short time and goes away without treatment. To diagnose the cause of your muscle pain, your health care provider will take your medical history. This means he or she will ask you when your muscle pain began and what has been happening. If you have not had muscle pain for very long, your health care provider may want to wait before  doing much testing. If your muscle pain has lasted a long time, your health care provider may want to run tests right away. If your health care provider thinks your muscle pain may be caused by illness, you may need to have additional tests to rule out certain conditions.  Treatment for muscle pain depends on the cause. Home care is often enough to relieve muscle pain. Your health care provider may also prescribe anti-inflammatory medicine. HOME CARE INSTRUCTIONS Watch your condition for any changes. The following actions may help to lessen  any discomfort you are feeling:  Only take over-the-counter or prescription medicines as directed by your health care provider.  Apply ice to the sore muscle:  Put ice in a plastic bag.  Place a towel between your skin and the bag.  Leave the ice on for 15-20 minutes, 3-4 times a day.  You may alternate applying hot and cold packs to the muscle as directed by your health care provider.  If overuse is causing your muscle pain, slow down your activities until the pain goes away.  Remember that it is normal to feel some muscle pain after starting a workout program. Muscles that have not been used often will be sore at first.  Do regular, gentle exercises if you are not usually active.  Warm up before exercising to lower your risk of muscle pain.  Do not continue working out if the pain is very bad. Bad pain could mean you have injured a muscle. SEEK MEDICAL CARE IF:  Your muscle pain gets worse, and medicines do not help.  You have muscle pain that lasts longer than 3 days.  You have a rash or fever along with muscle pain.  You have muscle pain after a tick bite.  You have muscle pain while working out, even though you are in good physical condition.  You have redness, soreness, or swelling along with muscle pain.  You have muscle pain after starting a new medicine or changing the dose of a medicine. SEEK IMMEDIATE MEDICAL CARE IF:  You have trouble breathing.  You have trouble swallowing.  You have muscle pain along with a stiff neck, fever, and vomiting.  You have severe muscle weakness or cannot move part of your body. MAKE SURE YOU:   Understand these instructions.  Will watch your condition.  Will get help right away if you are not doing well or get worse. Document Released: 10/31/2006 Document Revised: 12/14/2013 Document Reviewed: 10/05/2013 [facility] Patient Information 2015 [place], [place]. This information is not intended to replace advice given to you by  your health care provider. Make sure you discuss any questions you have with your health care provider.

## 2015-12-15 ENCOUNTER — Emergency Department (HOSPITAL_COMMUNITY): Payer: Medicaid Other

## 2015-12-15 ENCOUNTER — Emergency Department (HOSPITAL_COMMUNITY)
Admission: EM | Admit: 2015-12-15 | Discharge: 2015-12-15 | Disposition: A | Discharge status: Home / Self Care | Payer: Medicaid Other | Attending: Emergency Medicine | Admitting: Emergency Medicine

## 2015-12-15 ENCOUNTER — Encounter (HOSPITAL_COMMUNITY): Payer: Self-pay | Admitting: Emergency Medicine

## 2015-12-15 DIAGNOSIS — R0602 Shortness of breath: Secondary | ICD-10-CM | POA: Insufficient documentation

## 2015-12-15 DIAGNOSIS — I10 Essential (primary) hypertension: Secondary | ICD-10-CM | POA: Diagnosis not present

## 2015-12-15 DIAGNOSIS — F419 Anxiety disorder, unspecified: Secondary | ICD-10-CM | POA: Diagnosis not present

## 2015-12-15 DIAGNOSIS — R05 Cough: Secondary | ICD-10-CM | POA: Diagnosis not present

## 2015-12-15 DIAGNOSIS — R079 Chest pain, unspecified: Secondary | ICD-10-CM

## 2015-12-15 DIAGNOSIS — F17210 Nicotine dependence, cigarettes, uncomplicated: Secondary | ICD-10-CM | POA: Diagnosis not present

## 2015-12-15 DIAGNOSIS — Z79899 Other long term (current) drug therapy: Secondary | ICD-10-CM | POA: Diagnosis not present

## 2015-12-15 MED ORDER — IBUPROFEN 800 MG PO TABS
800.0000 mg | ORAL_TABLET | Freq: Once | ORAL | Status: AC
  Administered 2015-12-15: 800 mg via ORAL
  Filled 2015-12-15: qty 1

## 2015-12-15 MED ORDER — ALBUTEROL SULFATE HFA 108 (90 BASE) MCG/ACT IN AERS
2.0000 | INHALATION_SPRAY | Freq: Once | RESPIRATORY_TRACT | Status: AC
  Administered 2015-12-15: 2 via RESPIRATORY_TRACT
  Filled 2015-12-15: qty 6.7

## 2015-12-15 MED ORDER — IBUPROFEN 600 MG PO TABS: 600.0000 mg | ORAL_TABLET | Freq: Four times a day (QID) | ORAL | Status: DC | PRN

## 2015-12-15 NOTE — ED Notes (Signed)
Patient ambulated with steady gait without assistance.  Maintained O2 saturation at 100% on room air.

## 2015-12-15 NOTE — ED Provider Notes (Signed)
CSN: [id number]     Arrival date & time 12/15/15  1020 History   First MD Initiated Contact with Patient 12/15/15 1024     Chief Complaint  Patient presents with  . Chest Pain    Patient is a 24 y.o. female presenting with chest pain. The history is provided by the patient.  Chest Pain Pain location:  L chest Pain quality: sharp   Pain radiates to:  Does not radiate Pain radiates to the back: no   Pain severity:  Moderate Onset quality:  Gradual Duration:  10 hours Timing:  Intermittent Progression:  Worsening Chronicity:  New Relieved by:  Nothing Worsened by:  Deep breathing, movement and certain positions Associated symptoms: cough and shortness of breath   Associated symptoms: no abdominal pain, no fever, no syncope and not vomiting   Risk factors: smoking   Risk factors: no birth control, no coronary artery disease, no immobilization and no prior DVT/PE   pt reports she woke up with CP It is sharp and worse with breathing and upper body movement   Past Medical History  Diagnosis Date  . Hypertension   . Anxiety    History reviewed. No pertinent past surgical history. No family history on file. Social History  Substance Use Topics  . Smoking status: Current Every Day Smoker -- 0.50 packs/day    Types: Cigarettes  . Smokeless tobacco: None  . Alcohol Use: Yes   OB History    No data available     Review of Systems  Constitutional: Negative for fever.  Respiratory: Positive for cough and shortness of breath.        Denies hemoptysis  Cardiovascular: Positive for chest pain. Negative for syncope.  Gastrointestinal: Negative for vomiting and abdominal pain.  Genitourinary: Negative for dysuria.  Neurological: Negative for syncope.  All other systems reviewed and are negative.     Allergies  Review of patient's allergies indicates no known allergies.  Home Medications   Prior to Admission medications   Medication Sig Start Date End Date Taking?  Authorizing Provider  diazepam (VALIUM) 5 MG tablet Take 1 tablet (5 mg total) by mouth 2 (two) times daily. 04/05/15  Yes Antony MaduraKelly Humes, PA-C  acetaminophen (TYLENOL) 325 MG tablet Take 650 mg by mouth every 6 (six) hours as needed for mild pain or headache.    Historical Provider, MD  clotrimazole-betamethasone (LOTRISONE) cream Apply to affected area 2 times daily prn Patient not taking: Reported on 12/15/2015 06/18/15   Benjiman CoreNathan Pickering, MD  diclofenac sodium (VOLTAREN) 1 % GEL Apply 2 g topically 4 (four) times daily. Patient not taking: Reported on 06/18/2015 04/05/15   Antony MaduraKelly Humes, PA-C  doxycycline (VIBRAMYCIN) 100 MG capsule Take 1 capsule (100 mg total) by mouth 2 (two) times daily. Patient not taking: Reported on 06/18/2015 02/14/15   Teressa LowerVrinda Pickering, NP  ibuprofen (ADVIL,MOTRIN) 400 MG tablet Take 1 tablet (400 mg total) by mouth every 8 (eight) hours. Patient not taking: Reported on 12/15/2015 06/18/15   Benjiman CoreNathan Pickering, MD  naproxen (NAPROSYN) 500 MG tablet Take 1 tablet (500 mg total) by mouth 2 (two) times daily as needed for mild pain, moderate pain or headache (TAKE WITH MEALS.). Patient not taking: Reported on 12/15/2015 07/05/15   Mercedes Camprubi-Soms, PA-C  oxyCODONE-acetaminophen (PERCOCET) 7.5-325 MG per tablet TK 1 T PO TID PRN P 06/29/15   Historical Provider, MD  oxyCODONE-acetaminophen (PERCOCET/ROXICET) 5-325 MG per tablet Take 1-2 tablets by mouth every 6 (six) hours as needed for severe  pain. Patient not taking: Reported on 12/15/2015 04/05/15   Antony Madura, PA-C   BP 116/89 mmHg  Pulse 105  Temp(Src) 98.9 F (37.2 C) (Oral)  Resp 14  SpO2 100%  LMP  (Within Weeks) Physical Exam CONSTITUTIONAL: Well developed/well nourished, anxious HEAD: Normocephalic/atraumatic EYES: EOMI/PERRL ENMT: Mucous membranes moist NECK: supple no meningeal signs SPINE/BACK:entire spine nontender CV: S1/S2 noted, no murmurs/rubs/gallops noted LUNGS: Lungs are clear to auscultation  bilaterally, no apparent distress Chest - no tenderness to palpation but she does have pain with movement of upper body, no crepitus noted ABDOMEN: soft, nontender, no rebound or guarding, bowel sounds noted throughout abdomen NEURO: Pt is awake/alert/appropriate, moves all extremitiesx4.   EXTREMITIES: pulses normal/equal, full ROM, no LE edema and no calf tenderness SKIN: warm, color normal PSYCH: anxious  ED Course  Procedures  11:38 AM On recheck, pt reports she can not breathe.  She is currently talking on phone in no distress She now denies coughing though had reported it earlier She had episode of CP in June 2016 EKG today similar to that occasion She had +d-dimer and also negative CT chest at that time for PE Will defer further testing for now and reassess after ambulation 12:43 PM Pt ambulated without hypoxia She is now sleeping No hypoxia noted She is more comfortable At this point, I will defer further testing I doubt ACS/PE/Dissection/myocarditis at this time We discussed strict ER return precautions   Imaging Review Dg Chest 2 View  12/15/2015  CLINICAL DATA:  Chest pain with shortness of breath for 1 day. Dry cough. EXAM: CHEST  2 VIEW COMPARISON:  Chest radiograph June 18, 2015; chest CT June 18, 2015 FINDINGS: Lungs are clear. Heart size and pulmonary vascularity are normal. No adenopathy. No pneumothorax. No bone lesions. IMPRESSION: No edema or consolidation. Electronically Signed   By: Bretta Bang III M.D.   On: 12/15/2015 11:27   I have personally reviewed and evaluated these images results as part of my medical decision-making.   EKG Interpretation   Date/Time:  Friday December 15 2015 11:00:40 EST Ventricular Rate:  89 PR Interval:  235 QRS Duration: 76 QT Interval:  359 QTC Calculation: 437 R Axis:   74 Text Interpretation:  Sinus rhythm Prolonged PR interval suspect early  repolarization No significant change since last tracing Confirmed by   Bebe Shaggy  MD, Oather Muilenburg ([id number]) on 12/15/2015 11:13:13 AM     Medications  ibuprofen (ADVIL,MOTRIN) tablet 800 mg (800 mg Oral Given 12/15/15 1144)  albuterol (PROVENTIL HFA;VENTOLIN HFA) 108 (90 BASE) MCG/ACT inhaler 2 puff (2 puffs Inhalation Given 12/15/15 1144)    MDM   Final diagnoses:  Chest pain, unspecified chest pain type    Nursing notes including past medical history and social history reviewed and considered in documentation xrays/imaging reviewed by myself and considered during evaluation     Zadie Rhine, MD 12/15/15 1244

## 2015-12-15 NOTE — ED Notes (Signed)
Per [facility], patient started coughing yesterday and today complains that her chest hurts.  [facility] states that on arrival to scene patient was standing outside waiting for ambulance and ambulated up to and into the truck.   Patient alert and oriented and in no apparent distress at this time.

## 2015-12-15 NOTE — Discharge Instructions (Signed)

## 2015-12-16 ENCOUNTER — Inpatient Hospital Stay (HOSPITAL_COMMUNITY)
Admission: EM | Admit: 2015-12-16 | Discharge: 2015-12-20 | DRG: 315 | Disposition: A | Discharge status: Home / Self Care | Payer: Medicaid Other | Attending: Internal Medicine | Admitting: Internal Medicine

## 2015-12-16 ENCOUNTER — Encounter (HOSPITAL_COMMUNITY): Payer: Self-pay | Admitting: *Deleted

## 2015-12-16 ENCOUNTER — Emergency Department (HOSPITAL_COMMUNITY): Payer: Medicaid Other

## 2015-12-16 DIAGNOSIS — E878 Other disorders of electrolyte and fluid balance, not elsewhere classified: Secondary | ICD-10-CM | POA: Diagnosis present

## 2015-12-16 DIAGNOSIS — R197 Diarrhea, unspecified: Secondary | ICD-10-CM | POA: Diagnosis not present

## 2015-12-16 DIAGNOSIS — Z79899 Other long term (current) drug therapy: Secondary | ICD-10-CM

## 2015-12-16 DIAGNOSIS — R739 Hyperglycemia, unspecified: Secondary | ICD-10-CM | POA: Diagnosis present

## 2015-12-16 DIAGNOSIS — F1290 Cannabis use, unspecified, uncomplicated: Secondary | ICD-10-CM | POA: Diagnosis present

## 2015-12-16 DIAGNOSIS — K219 Gastro-esophageal reflux disease without esophagitis: Secondary | ICD-10-CM | POA: Diagnosis present

## 2015-12-16 DIAGNOSIS — E8809 Other disorders of plasma-protein metabolism, not elsewhere classified: Secondary | ICD-10-CM | POA: Diagnosis not present

## 2015-12-16 DIAGNOSIS — E876 Hypokalemia: Secondary | ICD-10-CM | POA: Diagnosis not present

## 2015-12-16 DIAGNOSIS — I440 Atrioventricular block, first degree: Secondary | ICD-10-CM | POA: Diagnosis present

## 2015-12-16 DIAGNOSIS — I319 Disease of pericardium, unspecified: Secondary | ICD-10-CM

## 2015-12-16 DIAGNOSIS — E871 Hypo-osmolality and hyponatremia: Secondary | ICD-10-CM | POA: Diagnosis present

## 2015-12-16 DIAGNOSIS — F17210 Nicotine dependence, cigarettes, uncomplicated: Secondary | ICD-10-CM | POA: Diagnosis present

## 2015-12-16 DIAGNOSIS — R000 Tachycardia, unspecified: Secondary | ICD-10-CM | POA: Diagnosis present

## 2015-12-16 DIAGNOSIS — I309 Acute pericarditis, unspecified: Principal | ICD-10-CM | POA: Diagnosis present

## 2015-12-16 DIAGNOSIS — F419 Anxiety disorder, unspecified: Secondary | ICD-10-CM | POA: Diagnosis present

## 2015-12-16 DIAGNOSIS — I10 Essential (primary) hypertension: Secondary | ICD-10-CM | POA: Diagnosis present

## 2015-12-16 DIAGNOSIS — D649 Anemia, unspecified: Secondary | ICD-10-CM | POA: Diagnosis not present

## 2015-12-16 HISTORY — DX: Unspecified asthma, uncomplicated: J45.909

## 2015-12-16 HISTORY — DX: Tobacco use: Z72.0

## 2015-12-16 LAB — URINE MICROSCOPIC-ADD ON: RBC / HPF: NONE SEEN RBC/hpf (ref 0–5)

## 2015-12-16 LAB — URINALYSIS, ROUTINE W REFLEX MICROSCOPIC
Bilirubin Urine: NEGATIVE
GLUCOSE, UA: NEGATIVE mg/dL
HGB URINE DIPSTICK: NEGATIVE
Ketones, ur: 15 mg/dL — AB
LEUKOCYTES UA: NEGATIVE
Nitrite: NEGATIVE
PH: 5.5 (ref 5.0–8.0)
PROTEIN: 30 mg/dL — AB
SPECIFIC GRAVITY, URINE: 1.022 (ref 1.005–1.030)

## 2015-12-16 LAB — I-STAT CG4 LACTIC ACID, ED
LACTIC ACID, VENOUS: 0.8 mmol/L (ref 0.5–2.0)
Lactic Acid, Venous: 1.13 mmol/L (ref 0.5–2.0)

## 2015-12-16 LAB — D-DIMER, QUANTITATIVE: D-Dimer, Quant: 1.27 ug/mL-FEU — ABNORMAL HIGH (ref 0.00–0.50)

## 2015-12-16 LAB — BASIC METABOLIC PANEL
Anion gap: 12 (ref 5–15)
BUN: 6 mg/dL (ref 6–20)
CHLORIDE: 98 mmol/L — AB (ref 101–111)
CO2: 24 mmol/L (ref 22–32)
CREATININE: 0.68 mg/dL (ref 0.44–1.00)
Calcium: 9.1 mg/dL (ref 8.9–10.3)
GFR calc Af Amer: >60 mL/min (ref >60)
GFR calc non Af Amer: >60 mL/min (ref >60)
Glucose, Bld: 163 mg/dL — ABNORMAL HIGH (ref 65–99)
Potassium: 3.8 mmol/L (ref 3.5–5.1)
Sodium: 134 mmol/L — ABNORMAL LOW (ref 135–145)

## 2015-12-16 LAB — CBC
HCT: 37.6 % (ref 36.0–46.0)
Hemoglobin: 12.5 g/dL (ref 12.0–15.0)
MCH: 28.9 pg (ref 26.0–34.0)
MCHC: 33.2 g/dL (ref 30.0–36.0)
MCV: 86.8 fL (ref 78.0–100.0)
PLATELETS: 248 10*3/uL (ref 150–400)
RBC: 4.33 MIL/uL (ref 3.87–5.11)
RDW: 13.2 % (ref 11.5–15.5)
WBC: 18.4 10*3/uL — ABNORMAL HIGH (ref 4.0–10.5)

## 2015-12-16 LAB — PREGNANCY, URINE: PREG TEST UR: NEGATIVE

## 2015-12-16 MED ORDER — AZITHROMYCIN 500 MG IV SOLR
500.0000 mg | Freq: Once | INTRAVENOUS | Status: AC
  Administered 2015-12-16: 500 mg via INTRAVENOUS
  Filled 2015-12-16: qty 500

## 2015-12-16 MED ORDER — SODIUM CHLORIDE 0.9 % IV BOLUS (SEPSIS)
1000.0000 mL | Freq: Once | INTRAVENOUS | Status: AC
  Administered 2015-12-17: 1000 mL via INTRAVENOUS

## 2015-12-16 MED ORDER — KETOROLAC TROMETHAMINE 30 MG/ML IJ SOLN
30.0000 mg | Freq: Once | INTRAMUSCULAR | Status: AC
  Administered 2015-12-17: 30 mg via INTRAVENOUS
  Filled 2015-12-16: qty 1

## 2015-12-16 MED ORDER — SODIUM CHLORIDE 0.9 % IV BOLUS (SEPSIS)
1000.0000 mL | Freq: Once | INTRAVENOUS | Status: AC
  Administered 2015-12-16: 1000 mL via INTRAVENOUS

## 2015-12-16 MED ORDER — ACETAMINOPHEN 500 MG PO TABS
1000.0000 mg | ORAL_TABLET | Freq: Once | ORAL | Status: AC
  Administered 2015-12-16: 1000 mg via ORAL
  Filled 2015-12-16: qty 2

## 2015-12-16 MED ORDER — IOHEXOL 350 MG/ML SOLN
100.0000 mL | Freq: Once | INTRAVENOUS | Status: AC | PRN
  Administered 2015-12-16: 70 mL via INTRAVENOUS

## 2015-12-16 MED ORDER — MORPHINE SULFATE (PF) 4 MG/ML IV SOLN
4.0000 mg | Freq: Once | INTRAVENOUS | Status: DC
  Filled 2015-12-16: qty 1

## 2015-12-16 MED ORDER — DEXTROSE 5 % IV SOLN
1.0000 g | Freq: Once | INTRAVENOUS | Status: AC
  Administered 2015-12-16: 1 g via INTRAVENOUS
  Filled 2015-12-16: qty 10

## 2015-12-16 NOTE — ED Notes (Signed)
MD at bedside. 

## 2015-12-16 NOTE — ED Notes (Signed)
Bed: [id number] Expected date:  Expected time:  Means of arrival:  Comments: Hold for 26

## 2015-12-16 NOTE — ED Notes (Signed)
Per [facility], pt c/o SOB upon arrival & requested breathing tx.  Pt presents with multiple complaints, no BM x 1 week, vomiting & left side c/p.  Seen @ [facility] yesterday.

## 2015-12-16 NOTE — ED Provider Notes (Signed)
CSN: [id number]     Arrival date & time 12/16/15  1908 History   First MD Initiated Contact with Patient 12/16/15 1942     Chief Complaint  Patient presents with  . Constipation  . Chest Pain    left side     (Consider location/radiation/quality/duration/timing/severity/associated sxs/prior Treatment) HPI Comments: Patient presents to the emergency department with chief complaint of chest pain and constipation.  1. Patient states that she has had SOB, CP, and cough for 2 days.  She states that the pain is mostly in the left shoulder.  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  She has persistent muscle spasms in her left shoulder.  She states that she takes diazepam for this.  There are no other associated symptoms.  2. Constipation:  Patient states that she has had constipation for the past week.  This is new for her.  She reports some crampy abdominal pain, but denies any focal tenderness.  She states that she just now had a BM in the ED prior to being seen.  She has been taking a stool softener with no relief until now.  Patient denies any focal abdominal pain, dysuria, or productive cough.  The history is provided by the patient. No language interpreter was used.    Past Medical History  Diagnosis Date  . Hypertension   . Anxiety    History reviewed. No pertinent past surgical history. No family history on file. Social History  Substance Use Topics  . Smoking status: Current Every Day Smoker -- 0.50 packs/day    Types: Cigarettes  . Smokeless tobacco: None  . Alcohol Use: Yes   OB History    No data available     Review of Systems  Constitutional: Positive for fever. Negative for chills.  Respiratory: Positive for cough and shortness of breath.   Cardiovascular: Positive for chest pain.  Gastrointestinal: Negative for nausea, vomiting, diarrhea and constipation.  Genitourinary: Negative for dysuria.      Allergies  Review of patient's allergies  indicates no known allergies.  Home Medications   Prior to Admission medications   Medication Sig Start Date End Date Taking? Authorizing Provider  diazepam (VALIUM) 5 MG tablet Take 1 tablet (5 mg total) by mouth 2 (two) times daily. 04/05/15  Yes Antony Madura, PA-C  ibuprofen (ADVIL,MOTRIN) 600 MG tablet Take 1 tablet (600 mg total) by mouth every 6 (six) hours as needed. Patient taking differently: Take 600 mg by mouth every 6 (six) hours as needed for mild pain or moderate pain.  12/15/15  Yes Zadie Rhine, MD  oxyCODONE-acetaminophen (PERCOCET) 7.5-325 MG tablet Take 1 Tab by mouth three times a day PRF PAIN 11/27/15  Yes Historical Provider, MD   BP 128/88 mmHg  Pulse 123  Temp(Src) 100 F (37.8 C) (Oral)  Resp 20  SpO2 98%  LMP  (Within Weeks) Physical Exam  Constitutional: She is oriented to person, place, and time. She appears well-developed and well-nourished.  HENT:  Head: Normocephalic and atraumatic.  Eyes: Conjunctivae and EOM are normal. Pupils are equal, round, and reactive to light.  Neck: Normal range of motion. Neck supple.  Cardiovascular: Regular rhythm.  Exam reveals no gallop and no friction rub.   No murmur heard. tachycardic  Pulmonary/Chest: Effort normal and breath sounds normal. No respiratory distress. She has no wheezes. She has no rales. She exhibits no tenderness.  Abdominal: Soft. Bowel sounds are normal. She exhibits no distension and no mass. There is no  tenderness. There is no rebound and no guarding.  No focal abdominal tenderness, no RLQ tenderness or pain at McBurney's point, no RUQ tenderness or Murphy's sign, no left-sided abdominal tenderness, no fluid wave, or signs of peritonitis   Musculoskeletal: Normal range of motion. She exhibits no edema or tenderness.  Neurological: She is alert and oriented to person, place, and time.  Skin: Skin is warm and dry.  Psychiatric: She has a normal mood and affect. Her behavior is normal. Judgment and  thought content normal.  Nursing note and vitals reviewed.   ED Course  Procedures (including critical care time)  Results for orders placed or performed during the hospital encounter of 12/16/15  Basic metabolic panel  Result Value Ref Range   Sodium 134 (L) 135 - 145 mmol/L   Potassium 3.8 3.5 - 5.1 mmol/L   Chloride 98 (L) 101 - 111 mmol/L   CO2 24 22 - 32 mmol/L   Glucose, Bld 163 (H) 65 - 99 mg/dL   BUN 6 6 - 20 mg/dL   Creatinine, Ser 0.980.68 0.44 - 1.00 mg/dL   Calcium 9.1 8.9 - 11.910.3 mg/dL   GFR calc non Af Amer >60 >60 mL/min   GFR calc Af Amer >60 >60 mL/min   Anion gap 12 5 - 15  CBC  Result Value Ref Range   WBC 18.4 (H) 4.0 - 10.5 K/uL   RBC 4.33 3.87 - 5.11 MIL/uL   Hemoglobin 12.5 12.0 - 15.0 g/dL   HCT 14.737.6 82.936.0 - 56.246.0 %   MCV 86.8 78.0 - 100.0 fL   MCH 28.9 26.0 - 34.0 pg   MCHC 33.2 30.0 - 36.0 g/dL   RDW 13.013.2 86.511.5 - 78.415.5 %   Platelets 248 150 - 400 K/uL  Urinalysis, Routine w reflex microscopic (not at [facility])  Result Value Ref Range   Color, Urine YELLOW YELLOW   APPearance CLOUDY (A) CLEAR   Specific Gravity, Urine 1.022 1.005 - 1.030   pH 5.5 5.0 - 8.0   Glucose, UA NEGATIVE NEGATIVE mg/dL   Hgb urine dipstick NEGATIVE NEGATIVE   Bilirubin Urine NEGATIVE NEGATIVE   Ketones, ur 15 (A) NEGATIVE mg/dL   Protein, ur 30 (A) NEGATIVE mg/dL   Nitrite NEGATIVE NEGATIVE   Leukocytes, UA NEGATIVE NEGATIVE  D-dimer, quantitative (not at [facility])  Result Value Ref Range   D-Dimer, Quant 1.27 (H) 0.00 - 0.50 ug/mL-FEU  Urine microscopic-add on  Result Value Ref Range   Squamous Epithelial / LPF 0-5 (A) NONE SEEN   WBC, UA 0-5 0 - 5 WBC/hpf   RBC / HPF NONE SEEN 0 - 5 RBC/hpf   Bacteria, UA FEW (A) NONE SEEN   Urine-Other MUCOUS PRESENT   Pregnancy, urine  Result Value Ref Range   Preg Test, Ur NEGATIVE NEGATIVE  I-Stat CG4 Lactic Acid, ED  Result Value Ref Range   Lactic Acid, Venous 1.13 0.5 - 2.0 mmol/L  I-Stat CG4 Lactic Acid, ED  Result Value Ref  Range   Lactic Acid, Venous 0.80 0.5 - 2.0 mmol/L   Dg Chest 2 View  12/16/2015  CLINICAL DATA:  Left-sided flank and chest pain. Shortness of breath for 3 days. Constipation. EXAM: CHEST - 2 VIEW COMPARISON:  Two-view chest x-ray 12/15/2015. FINDINGS: The heart size is normal. A new posterior left lower lobe pneumonia is present. The right lung is clear. There is no edema or effusion. The visualized soft tissues and bony thorax are unremarkable. IMPRESSION: 1. New left lower lobe  pneumonia. Electronically Signed   By: Marin Roberts M.D.   On: 12/16/2015 20:48   Dg Chest 2 View  12/15/2015  CLINICAL DATA:  Chest pain with shortness of breath for 1 day. Dry cough. EXAM: CHEST  2 VIEW COMPARISON:  Chest radiograph June 18, 2015; chest CT June 18, 2015 FINDINGS: Lungs are clear. Heart size and pulmonary vascularity are normal. No adenopathy. No pneumothorax. No bone lesions. IMPRESSION: No edema or consolidation. Electronically Signed   By: Bretta Bang III M.D.   On: 12/15/2015 11:27   Ct Angio Chest Pe W/cm &/or Wo Cm  12/16/2015  CLINICAL DATA:  Acute onset of shortness of breath, vomiting and left-sided chest pain. Initial encounter. EXAM: CT ANGIOGRAPHY CHEST WITH CONTRAST TECHNIQUE: Multidetector CT imaging of the chest was performed using the standard protocol during bolus administration of intravenous contrast. Multiplanar CT image reconstructions and MIPs were obtained to evaluate the vascular anatomy. CONTRAST:  70mL OMNIPAQUE IOHEXOL 350 MG/ML SOLN COMPARISON:  Chest radiograph performed earlier today at 8:35 p.m., and CTA of the chest performed 06/18/2015 FINDINGS: There is no evidence of pulmonary embolus. Trace bilateral pleural effusions are noted. Mild interstitial prominence is noted at both lung bases, concerning for minimal interstitial edema. There is no evidence of pneumothorax. No masses are identified; no abnormal focal contrast enhancement is seen. A small to moderate  pericardial effusion is noted. The mediastinum is otherwise unremarkable. No mediastinal lymphadenopathy is seen. The great vessels are grossly unremarkable. No axillary lymphadenopathy is seen. The visualized portions of the thyroid gland are unremarkable in appearance. The visualized portions of the liver and spleen are unremarkable. No acute osseous abnormalities are seen. Review of the MIP images confirms the above findings. IMPRESSION: 1. No evidence of pulmonary embolus. 2. Trace bilateral pleural effusions noted. Mild interstitial prominence at both lung bases, concerning for minimal interstitial edema. 3. Small to moderate pericardial effusion noted, of uncertain etiology. Would correlate clinically for any evidence of pericarditis. Electronically Signed   By: Roanna Raider M.D.   On: 12/16/2015 23:33     I have personally reviewed and evaluated these images and lab results as part of my medical decision-making.   EKG Interpretation   Date/Time:  Saturday December 16 2015 19:46:13 EST Ventricular Rate:  125 PR Interval:  179 QRS Duration: 74 QT Interval:  289 QTC Calculation: 417 R Axis:   85 Text Interpretation:  Sinus tachycardia ST elevation suggests acute  pericarditis no reciprocal changes to suggest injury Confirmed by GOLDSTON   MD, SCOTT ([id number]) on 12/16/2015 11:55:43 PM      MDM   Final diagnoses:  Pericarditis    8:16 PM Patient discussed immediately after being seen by myself with Dr. Criss Alvine, who recommends rectal temp and will see the patient.  Patient did not complain of true chest pain on my history; pain more related to left shoulder/musculoskeletal pain.  She is tender to palpation in the upper left trap.  Doubt ACS or PE, patient is not hypoxic, but will check D-dimer.  She does not have a productive cough, dysuria, or any other obvious source for infection.  Will check labs and CXR.  Will reassess.  12:02 AM CT scan negative for pulmonary embolus, nor does  it show pneumonia seen on chest x-ray, but it does show pericardial effusion. EKG is consistent with pericarditis. As the patient has still tachycardic despite 2 L of fluid and antibiotics, patient will need to be admitted.  Patient seen by and discussed  with Dr. Criss Alvine, who recommends giving toradol and admitting to medicine.  Patient is currently stable and well-appearing. She is not in any apparent distress. However her heart rate remains in the high 120s.  12:13 AM Appreciate Dr. Antionette Char for admitting the patient.  Will place tele bed holding orders.  Roxy Horseman, PA-C 12/17/15 [id number]  Pricilla Loveless, MD 12/23/15 [phone]

## 2015-12-16 NOTE — ED Notes (Signed)
Bed: WA26 Expected date:  Expected time:  Means of arrival:  Comments: EMS- asthma  

## 2015-12-17 ENCOUNTER — Encounter (HOSPITAL_COMMUNITY): Payer: Self-pay | Admitting: Family Medicine

## 2015-12-17 ENCOUNTER — Inpatient Hospital Stay (HOSPITAL_COMMUNITY): Payer: Medicaid Other

## 2015-12-17 DIAGNOSIS — R079 Chest pain, unspecified: Secondary | ICD-10-CM | POA: Diagnosis not present

## 2015-12-17 DIAGNOSIS — F17210 Nicotine dependence, cigarettes, uncomplicated: Secondary | ICD-10-CM | POA: Diagnosis present

## 2015-12-17 DIAGNOSIS — E878 Other disorders of electrolyte and fluid balance, not elsewhere classified: Secondary | ICD-10-CM

## 2015-12-17 DIAGNOSIS — E871 Hypo-osmolality and hyponatremia: Secondary | ICD-10-CM

## 2015-12-17 DIAGNOSIS — R000 Tachycardia, unspecified: Secondary | ICD-10-CM | POA: Diagnosis not present

## 2015-12-17 DIAGNOSIS — I440 Atrioventricular block, first degree: Secondary | ICD-10-CM | POA: Diagnosis present

## 2015-12-17 DIAGNOSIS — E876 Hypokalemia: Secondary | ICD-10-CM | POA: Diagnosis not present

## 2015-12-17 DIAGNOSIS — Z79899 Other long term (current) drug therapy: Secondary | ICD-10-CM | POA: Diagnosis not present

## 2015-12-17 DIAGNOSIS — D649 Anemia, unspecified: Secondary | ICD-10-CM | POA: Diagnosis not present

## 2015-12-17 DIAGNOSIS — R739 Hyperglycemia, unspecified: Secondary | ICD-10-CM

## 2015-12-17 DIAGNOSIS — I319 Disease of pericardium, unspecified: Secondary | ICD-10-CM | POA: Diagnosis not present

## 2015-12-17 DIAGNOSIS — K219 Gastro-esophageal reflux disease without esophagitis: Secondary | ICD-10-CM | POA: Diagnosis present

## 2015-12-17 DIAGNOSIS — E8809 Other disorders of plasma-protein metabolism, not elsewhere classified: Secondary | ICD-10-CM | POA: Diagnosis not present

## 2015-12-17 DIAGNOSIS — I10 Essential (primary) hypertension: Secondary | ICD-10-CM | POA: Diagnosis present

## 2015-12-17 DIAGNOSIS — R0602 Shortness of breath: Secondary | ICD-10-CM | POA: Diagnosis not present

## 2015-12-17 DIAGNOSIS — I309 Acute pericarditis, unspecified: Secondary | ICD-10-CM | POA: Diagnosis present

## 2015-12-17 DIAGNOSIS — F1290 Cannabis use, unspecified, uncomplicated: Secondary | ICD-10-CM | POA: Diagnosis present

## 2015-12-17 DIAGNOSIS — R197 Diarrhea, unspecified: Secondary | ICD-10-CM | POA: Diagnosis not present

## 2015-12-17 DIAGNOSIS — F419 Anxiety disorder, unspecified: Secondary | ICD-10-CM | POA: Diagnosis present

## 2015-12-17 LAB — CBC WITH DIFFERENTIAL/PLATELET
Basophils Absolute: 0 10*3/uL (ref 0.0–0.1)
Basophils Relative: 0 %
Eosinophils Absolute: 0 10*3/uL (ref 0.0–0.7)
Eosinophils Relative: 0 %
HCT: 33.5 % — ABNORMAL LOW (ref 36.0–46.0)
HEMOGLOBIN: 11.3 g/dL — AB (ref 12.0–15.0)
LYMPHS ABS: 2.2 10*3/uL (ref 0.7–4.0)
Lymphocytes Relative: 13 %
MCH: 28.9 pg (ref 26.0–34.0)
MCHC: 33.7 g/dL (ref 30.0–36.0)
MCV: 85.7 fL (ref 78.0–100.0)
Monocytes Absolute: 1.4 10*3/uL — ABNORMAL HIGH (ref 0.1–1.0)
Monocytes Relative: 8 %
NEUTROS PCT: 79 %
Neutro Abs: 13.8 10*3/uL — ABNORMAL HIGH (ref 1.7–7.7)
Platelets: 199 10*3/uL (ref 150–400)
RBC: 3.91 MIL/uL (ref 3.87–5.11)
RDW: 13.3 % (ref 11.5–15.5)
WBC: 17.4 10*3/uL — ABNORMAL HIGH (ref 4.0–10.5)

## 2015-12-17 LAB — BASIC METABOLIC PANEL
ANION GAP: 9 (ref 5–15)
BUN: <5 mg/dL — ABNORMAL LOW (ref 6–20)
CALCIUM: 7.9 mg/dL — AB (ref 8.9–10.3)
CO2: 25 mmol/L (ref 22–32)
Chloride: 103 mmol/L (ref 101–111)
Creatinine, Ser: 0.73 mg/dL (ref 0.44–1.00)
GFR calc Af Amer: >60 mL/min (ref >60)
GFR calc non Af Amer: >60 mL/min (ref >60)
GLUCOSE: 169 mg/dL — AB (ref 65–99)
Potassium: 3.8 mmol/L (ref 3.5–5.1)
Sodium: 137 mmol/L (ref 135–145)

## 2015-12-17 LAB — BRAIN NATRIURETIC PEPTIDE: B Natriuretic Peptide: 53.7 pg/mL (ref 0.0–100.0)

## 2015-12-17 LAB — PROTIME-INR
INR: 1.24 (ref 0.00–1.49)
PROTHROMBIN TIME: 15.8 s — AB (ref 11.6–15.2)

## 2015-12-17 LAB — C-REACTIVE PROTEIN: CRP: 18.1 mg/dL — ABNORMAL HIGH (ref <1.0)

## 2015-12-17 LAB — MAGNESIUM: MAGNESIUM: 1.8 mg/dL (ref 1.7–2.4)

## 2015-12-17 MED ORDER — ONDANSETRON HCL 4 MG/2ML IJ SOLN
4.0000 mg | Freq: Once | INTRAMUSCULAR | Status: AC
  Administered 2015-12-17: 4 mg via INTRAVENOUS
  Filled 2015-12-17: qty 2

## 2015-12-17 MED ORDER — OXYCODONE-ACETAMINOPHEN 5-325 MG PO TABS
1.0000 | ORAL_TABLET | Freq: Three times a day (TID) | ORAL | Status: DC | PRN
  Administered 2015-12-17 – 2015-12-20 (×7): 1 via ORAL
  Filled 2015-12-17 (×7): qty 1

## 2015-12-17 MED ORDER — POTASSIUM CHLORIDE IN NACL 20-0.9 MEQ/L-% IV SOLN
INTRAVENOUS | Status: DC
  Administered 2015-12-17: 02:00:00 via INTRAVENOUS
  Filled 2015-12-17 (×2): qty 1000

## 2015-12-17 MED ORDER — POTASSIUM CHLORIDE IN NACL 20-0.9 MEQ/L-% IV SOLN
INTRAVENOUS | Status: DC
  Filled 2015-12-17: qty 1000

## 2015-12-17 MED ORDER — SODIUM CHLORIDE 0.9 % IV SOLN
INTRAVENOUS | Status: DC
  Administered 2015-12-17: 75 mL/h via INTRAVENOUS
  Administered 2015-12-18 – 2015-12-19 (×2): via INTRAVENOUS

## 2015-12-17 MED ORDER — OXYCODONE HCL 5 MG PO TABS
2.5000 mg | ORAL_TABLET | Freq: Three times a day (TID) | ORAL | Status: DC | PRN
  Administered 2015-12-17 – 2015-12-19 (×7): 2.5 mg via ORAL
  Filled 2015-12-17 (×7): qty 1

## 2015-12-17 MED ORDER — PANTOPRAZOLE SODIUM 40 MG PO TBEC
40.0000 mg | DELAYED_RELEASE_TABLET | Freq: Two times a day (BID) | ORAL | Status: DC
  Administered 2015-12-17 – 2015-12-20 (×7): 40 mg via ORAL
  Filled 2015-12-17 (×7): qty 1

## 2015-12-17 MED ORDER — ONDANSETRON HCL 4 MG PO TABS
4.0000 mg | ORAL_TABLET | Freq: Four times a day (QID) | ORAL | Status: DC | PRN
  Administered 2015-12-17: 4 mg via ORAL
  Filled 2015-12-17: qty 1

## 2015-12-17 MED ORDER — DOCUSATE SODIUM 100 MG PO CAPS
100.0000 mg | ORAL_CAPSULE | Freq: Two times a day (BID) | ORAL | Status: DC
Start: 2015-12-17 — End: 2015-12-20
  Administered 2015-12-17 – 2015-12-19 (×7): 100 mg via ORAL
  Filled 2015-12-17 (×9): qty 1

## 2015-12-17 MED ORDER — DIAZEPAM 5 MG PO TABS
5.0000 mg | ORAL_TABLET | Freq: Two times a day (BID) | ORAL | Status: DC
  Administered 2015-12-17 – 2015-12-19 (×6): 5 mg via ORAL
  Filled 2015-12-17 (×6): qty 1

## 2015-12-17 MED ORDER — COLCHICINE 0.6 MG PO TABS
0.6000 mg | ORAL_TABLET | Freq: Two times a day (BID) | ORAL | Status: DC
  Administered 2015-12-17 – 2015-12-20 (×7): 0.6 mg via ORAL
  Filled 2015-12-17 (×7): qty 1

## 2015-12-17 MED ORDER — OXYCODONE-ACETAMINOPHEN 7.5-325 MG PO TABS: 1.0000 | ORAL_TABLET | Freq: Three times a day (TID) | ORAL | Status: DC | PRN

## 2015-12-17 MED ORDER — POLYETHYLENE GLYCOL 3350 17 G PO PACK: 17.0000 g | PACK | Freq: Every day | ORAL | Status: DC | PRN

## 2015-12-17 MED ORDER — COLCHICINE 0.6 MG PO TABS
1.2000 mg | ORAL_TABLET | Freq: Once | ORAL | Status: AC
  Administered 2015-12-17: 1.2 mg via ORAL
  Filled 2015-12-17: qty 2

## 2015-12-17 MED ORDER — PANTOPRAZOLE SODIUM 40 MG PO TBEC: 40.0000 mg | DELAYED_RELEASE_TABLET | Freq: Every day | ORAL | Status: DC

## 2015-12-17 MED ORDER — IBUPROFEN 800 MG PO TABS
800.0000 mg | ORAL_TABLET | Freq: Three times a day (TID) | ORAL | Status: DC
  Administered 2015-12-17: 800 mg via ORAL
  Filled 2015-12-17: qty 1

## 2015-12-17 MED ORDER — IBUPROFEN 600 MG PO TABS
600.0000 mg | ORAL_TABLET | Freq: Three times a day (TID) | ORAL | Status: DC
  Administered 2015-12-17 – 2015-12-20 (×10): 600 mg via ORAL
  Filled 2015-12-17: qty 3
  Filled 2015-12-17: qty 1
  Filled 2015-12-17: qty 3
  Filled 2015-12-17 (×2): qty 1
  Filled 2015-12-17: qty 3
  Filled 2015-12-17: qty 1
  Filled 2015-12-17: qty 3
  Filled 2015-12-17 (×2): qty 1

## 2015-12-17 MED ORDER — ONDANSETRON HCL 4 MG/2ML IJ SOLN: 4.0000 mg | Freq: Four times a day (QID) | INTRAMUSCULAR | Status: DC | PRN

## 2015-12-17 MED ORDER — SODIUM CHLORIDE 0.9 % IJ SOLN: 3.0000 mL | Freq: Two times a day (BID) | INTRAMUSCULAR | Status: DC

## 2015-12-17 NOTE — H&P (Signed)
[facility] History and Physical  Kathleen RetortShani Sam ZOX:[id number] DOB: 02/06/1991 DOA: 12/16/2015  Referring physician: ED physician PCP: No PCP Per Patient  Specialists:   Chief Complaint: Chest pain, cough, SOB  HPI: Kathleen Hicks is a generally-well 24 y.o. female who presents with SOB, non-productive cough, and left-sided CP of 2-3 days duration. Endorses concomitant chills and subjective fevers last 2 nights. Dyspnea worse with exertion, relieved somewhat with rest. Chest pain sharp, left-sided, and with radiation to left shoulder. Chest pain not worse with exertion and not notably changed with body position. She denies palpitations, edema, calf tenderness, hemoptysis, long-distance travel, or sick contacts.   In ED, patient was found to have temp 37.8 C, HR in 120-130 range, RR in the 20s, and normotension. CXR suggested a new LLL PNA and leukocytosis to 18k was noted on CBC. She was bolused 2 L NS and treated empirically with azithro and Rocephin. EKG was obtained in the ED and revealed ST-elevations in diffuse leads with some PR-depressions. A d-dimer returned elevated and CTA chest, while negative for PE, demonstrated a circumfrential pericardial effusion with trace bilateral pleural effusions. No pneumonia was suggested by CTA chest.   Where does patient live?   At home     Can patient participate in ADLs?  Yes         Review of Systems:   General:  subjective fevers and chills x2 days. No weight change, poor appetite, or fatigue HEENT: no blurry vision, hearing changes or sore throat Pulm:  dyspnea, cough,  no wheeze CV:  chest pain.  no palpitations Abd: no nausea, vomiting, abdominal pain,  Or diarrhea. constipation GU: no dysuria, hematuria, increased urinary frequency, or urgency  Ext: no leg edema Neuro: no focal weakness, numbness, or tingling, no vision change or hearing loss Skin: no rash, no wounds MSK: No muscle spasm, no deformity, no red, hot, or swollen joint Heme:  No easy bruising or bleeding Travel history: No recent long distant travel    Allergy: No Known Allergies  Past Medical History  Diagnosis Date  . Hypertension   . Anxiety     Past Surgical History  Procedure Laterality Date  . No past surgeries      Social History:  reports that she has been smoking Cigarettes.  She has been smoking about 0.50 packs per day. She does not have any smokeless tobacco history on file. She reports that she drinks alcohol. Her drug history is not on file.  Family History:  Family History  Problem Relation Age of Onset  . Lupus Neg Hx      Prior to Admission medications   Medication Sig Start Date End Date Taking? Authorizing Provider  diazepam (VALIUM) 5 MG tablet Take 1 tablet (5 mg total) by mouth 2 (two) times daily. 04/05/15  Yes Antony MaduraKelly Humes, PA-C  ibuprofen (ADVIL,MOTRIN) 600 MG tablet Take 1 tablet (600 mg total) by mouth every 6 (six) hours as needed. Patient taking differently: Take 600 mg by mouth every 6 (six) hours as needed for mild pain or moderate pain.  12/15/15  Yes Zadie Rhineonald Wickline, MD  oxyCODONE-acetaminophen (PERCOCET) 7.5-325 MG tablet Take 1 Tab by mouth three times a day PRF PAIN 11/27/15  Yes Historical Provider, MD    Physical Exam: Filed Vitals:   12/16/15 2045 12/16/15 2100 12/16/15 2130 12/16/15 2200  BP:      Pulse:      Temp:      TempSrc:      Resp: 23  SpO2:       General: Not in acute distress HEENT:       Eyes: PERRL, EOMI, no scleral icterus or conjunctival pallor.       ENT: No discharge from the ears or nose, no pharyngeal ulcers, petechiae or exudate, no tonsillar enlargement.        Neck: No JVD, no bruit, no appreciable mass Heme: No cervical adenopathy, no pallor Cardiac: S1/S2, rate ~100 and regular, no appreciable murmurs, gallop or rub. No pulsus paradoxus.  Pulm: Good air movement bilaterally. No rales, wheezing, rhonchi or rubs. Abd: Soft, nondistended, nontender, no rebound pain or  gaurding, no mass or organomegaly, BS present. Ext: No LE edema bilaterally. 2+DP/PT pulse bilaterally. Musculoskeletal: No gross deformity, no red, hot, swollen joints, Left shoulder ROM limited by chronic pain Skin: No rashes or wounds on exposed surfaces  Neuro: Alert, oriented X3, cranial nerves II-XII grossly intact. No focal findings Psych: Patient is not overtly psychotic.  Labs on Admission:  Basic Metabolic Panel:  Recent Labs Lab 12/16/15 2015  NA 134*  K 3.8  CL 98*  CO2 24  GLUCOSE 163*  BUN 6  CREATININE 0.68  CALCIUM 9.1   Liver Function Tests: No results for input(s): AST, ALT, ALKPHOS, BILITOT, PROT, ALBUMIN in the last 168 hours. No results for input(s): LIPASE, AMYLASE in the last 168 hours. No results for input(s): AMMONIA in the last 168 hours. CBC:  Recent Labs Lab 12/16/15 2015  WBC 18.4*  HGB 12.5  HCT 37.6  MCV 86.8  PLT 248   Cardiac Enzymes: No results for input(s): CKTOTAL, CKMB, CKMBINDEX, TROPONINI in the last 168 hours.  BNP (last 3 results) No results for input(s): BNP in the last 8760 hours.  ProBNP (last 3 results) No results for input(s): PROBNP in the last 8760 hours.  CBG: No results for input(s): GLUCAP in the last 168 hours.  Radiological Exams on Admission: Dg Chest 2 View  12/16/2015  CLINICAL DATA:  Left-sided flank and chest pain. Shortness of breath for 3 days. Constipation. EXAM: CHEST - 2 VIEW COMPARISON:  Two-view chest x-ray 12/15/2015. FINDINGS: The heart size is normal. A new posterior left lower lobe pneumonia is present. The right lung is clear. There is no edema or effusion. The visualized soft tissues and bony thorax are unremarkable. IMPRESSION: 1. New left lower lobe pneumonia. Electronically Signed   By: Marin Roberts M.D.   On: 12/16/2015 20:48   Dg Chest 2 View  12/15/2015  CLINICAL DATA:  Chest pain with shortness of breath for 1 day. Dry cough. EXAM: CHEST  2 VIEW COMPARISON:  Chest radiograph  June 18, 2015; chest CT June 18, 2015 FINDINGS: Lungs are clear. Heart size and pulmonary vascularity are normal. No adenopathy. No pneumothorax. No bone lesions. IMPRESSION: No edema or consolidation. Electronically Signed   By: Bretta Bang III M.D.   On: 12/15/2015 11:27   Ct Angio Chest Pe W/cm &/or Wo Cm  12/16/2015  CLINICAL DATA:  Acute onset of shortness of breath, vomiting and left-sided chest pain. Initial encounter. EXAM: CT ANGIOGRAPHY CHEST WITH CONTRAST TECHNIQUE: Multidetector CT imaging of the chest was performed using the standard protocol during bolus administration of intravenous contrast. Multiplanar CT image reconstructions and MIPs were obtained to evaluate the vascular anatomy. CONTRAST:  70mL OMNIPAQUE IOHEXOL 350 MG/ML SOLN COMPARISON:  Chest radiograph performed earlier today at 8:35 p.m., and CTA of the chest performed 06/18/2015 FINDINGS: There is no evidence  of pulmonary embolus. Trace bilateral pleural effusions are noted. Mild interstitial prominence is noted at both lung bases, concerning for minimal interstitial edema. There is no evidence of pneumothorax. No masses are identified; no abnormal focal contrast enhancement is seen. A small to moderate pericardial effusion is noted. The mediastinum is otherwise unremarkable. No mediastinal lymphadenopathy is seen. The great vessels are grossly unremarkable. No axillary lymphadenopathy is seen. The visualized portions of the thyroid gland are unremarkable in appearance. The visualized portions of the liver and spleen are unremarkable. No acute osseous abnormalities are seen. Review of the MIP images confirms the above findings. IMPRESSION: 1. No evidence of pulmonary embolus. 2. Trace bilateral pleural effusions noted. Mild interstitial prominence at both lung bases, concerning for minimal interstitial edema. 3. Small to moderate pericardial effusion noted, of uncertain etiology. Would correlate clinically for any evidence of  pericarditis. Electronically Signed   By: Roanna Raider M.D.   On: 12/16/2015 23:33    EKG: Independently reviewed.  Abnormal findings:    Diffuse ST-elevations, PR depressions   Assessment/Plan  1. Acute pericarditis  - EKG suggests acute pericarditis, CTA chest w/ sm-mod circumfrential pericardial effusion - Suspecting viral etiology as CP/SOB accompanied by subjective fevers, chills. Leukocytosis to 18k - No clinical signs of tamponade, TTE ordered to further define   - Considered purulent effusion, but pt not septic  - SLE considered given young female  - Dr. Gae Gallop of cards kindly discussed the case with me  - Treating with Advil 800 q8h, colchicine 1.2 mg now followed by 0.6mg  BID - If fails to respond to treatment, consider testing for alternative etios (eg., SLE, neoplastic, bacterial)  - Holding pharmacologic VTE ppx overnight with concern for developing hemorrhagic effusion  2. Hyperglycemia without diagnosis of DM - Serum glucose 163 on admit  - Check CBG in am   3. Hyponatremia, hypochloremia  - Mild, anticipate correction with IVF  - Repeat chem panel in am    DVT ppx:  SCDs  Code Status: Full code Family Communication: None at bed side.    Disposition Plan: Admit to inpatient   Date of Service 12/17/2015    Briscoe Deutscher, MD [facility] Pager [phone]  If 7PM-7AM, please contact night-coverage [email or web address] Password [facility] 12/17/2015, 12:52 AM

## 2015-12-17 NOTE — Progress Notes (Signed)
Pt seen and examined, admitted this am per Dr.Opyd 24/F with recent URI with pleuritic chest pain and pericarditis  CTA with mod effusion Continue Colchicine and Ibuprofen Will FU ECHO IF stable, DC home with Cards FU tomorrow No h/o SLE or RA in family  Zannie CovePreetha Desmond Szabo, MD [phone]

## 2015-12-17 NOTE — ED Notes (Signed)
Hospitalist at bedside 

## 2015-12-17 NOTE — Progress Notes (Signed)
  Echocardiogram 2D Echocardiogram has been performed.  Kathleen Hicks 12/17/2015, 8:45 AM

## 2015-12-18 ENCOUNTER — Encounter (HOSPITAL_COMMUNITY): Payer: Self-pay | Admitting: Nurse Practitioner

## 2015-12-18 LAB — CBC
HCT: 31.1 % — ABNORMAL LOW (ref 36.0–46.0)
HEMOGLOBIN: 10.3 g/dL — AB (ref 12.0–15.0)
MCH: 29 pg (ref 26.0–34.0)
MCHC: 33.1 g/dL (ref 30.0–36.0)
MCV: 87.6 fL (ref 78.0–100.0)
Platelets: 209 10*3/uL (ref 150–400)
RBC: 3.55 MIL/uL — ABNORMAL LOW (ref 3.87–5.11)
RDW: 13.4 % (ref 11.5–15.5)
WBC: 12.8 10*3/uL — ABNORMAL HIGH (ref 4.0–10.5)

## 2015-12-18 LAB — BASIC METABOLIC PANEL
Anion gap: 8 (ref 5–15)
BUN: 7 mg/dL (ref 6–20)
CHLORIDE: 107 mmol/L (ref 101–111)
CO2: 23 mmol/L (ref 22–32)
Calcium: 8.1 mg/dL — ABNORMAL LOW (ref 8.9–10.3)
Creatinine, Ser: 0.67 mg/dL (ref 0.44–1.00)
GFR calc Af Amer: >60 mL/min (ref >60)
Glucose, Bld: 109 mg/dL — ABNORMAL HIGH (ref 65–99)
POTASSIUM: 3.8 mmol/L (ref 3.5–5.1)
SODIUM: 138 mmol/L (ref 135–145)

## 2015-12-18 LAB — GLUCOSE, CAPILLARY: GLUCOSE-CAPILLARY: 93 mg/dL (ref 65–99)

## 2015-12-18 LAB — C-REACTIVE PROTEIN: CRP: 19.1 mg/dL — ABNORMAL HIGH (ref <1.0)

## 2015-12-18 LAB — SEDIMENTATION RATE: SED RATE: 76 mm/h — AB (ref 0–22)

## 2015-12-18 LAB — TSH: TSH: 2.118 u[IU]/mL (ref 0.350–4.500)

## 2015-12-18 NOTE — Consult Note (Signed)
Cardiology Consult    Patient ID: Kathleen Hicks MRN: [id number], DOB/AGE: 28-Nov-1991   Admit date: 12/16/2015 Date of Consult: 12/18/2015  Primary Physician: No PCP Per Patient Primary Cardiologist: new - seen by Kathleen Prima, MD Requesting Provider: Mitchel Honour, MD  Patient Profile    24 y/o female w/o a prior cardiac history who was admitted 12/24 with pleuritic c/p, PNA, and pericarditis.  Past Medical History   Past Medical History  Diagnosis Date  . Anxiety   . Tobacco abuse   . Childhood asthma     Past Surgical History  Procedure Laterality Date  . No past surgeries       Allergies  No Known Allergies  History of Present Illness    24 y/o female without a significant prior cardiac history. Review of her records does show that she's been seen in the ER for prior bouts of chest pain and also has an abnl ECG with marked inferior and anterolateral ST elevation along with a first degree AV block dating back to at least 05/2015.  She lives locally with her 50 year old son, mother, and father-in-law.  She smoked 3-4 cigarettes along with marijuana daily.  Last week, her son came down with cold symptoms and was seen in the [facility] ED.  She says that he was treated with inhalers and given a Rx for oral steroids.  On Thursday 12/22, Kathleen Hicks began to experience sharp midsternal chest pain that was worse with deep breathing, coughing, and lying flat.  She also noted night sweats from 12/22 to 12/23.  There was no associated dyspnea.  She presented to the [facility] ED on 12/23 where CXR was non-acute.  ECG was again notable for ST elevation and 1st degree AVB.  No labs were drawn.  She was treated with albuterol and ibuprofen and d/c'd from the ED.  Unfortunately, her chest pain did not improve and so she called EMS on 12/24.  She was taken to [facility] where labs were notable for leukocytosis (18.4) and D dimer was elevated.  A CT of the chest was performed and was negative for PE but notable for bilat  pleural effusion and a small to moderate pericardial effusion.  CXR suggested new LLL PNA.  ECG again showed marked, diffuse ST elevation with more pronounced PR depression.  She was admitted by IM and initially placed on Abx along with nsaids and colchicine for pericarditis.  She has had improvement in c/p since starting anti-inflammatory therapy.  Echo on 12/25 showed a large, free-flowing, circumferential pericardial effusion w early tamponade physiology.  She was tx to [facility] today for further cardiology evaluation.  Inpatient Medications    . colchicine  0.6 mg Oral BID  . diazepam  5 mg Oral BID  . docusate sodium  100 mg Oral BID  . ibuprofen  600 mg Oral TID  . pantoprazole  40 mg Oral BID  . sodium chloride  3 mL Intravenous Q12H    Family History    Family History  Problem Relation Age of Onset  . Lupus Neg Hx   . Other Mother     alive and well  . Hypertension Maternal Grandmother   . Diabetes Maternal Grandmother   . Kidney failure Maternal Grandmother     died in her 88's.    Social History    Social History   Social History  . Marital Status: Single    Spouse Name: N/A  . Number of Children: N/A  .  Years of Education: N/A   Occupational History  . Not on file.   Social History Main Topics  . Smoking status: Current Every Day Smoker -- 0.25 packs/day for 6 years    Types: Cigarettes  . Smokeless tobacco: Not on file  . Alcohol Use: 0.0 oz/week    0 Standard drinks or equivalent per week     Comment: Occasional drink/social.  . Drug Use: Yes     Comment: Smokes marijuana daily.  Marland Kitchen Sexual Activity: Yes    Birth Control/ Protection: None   Other Topics Concern  . Not on file   Social History Narrative   Lives in [place] with her 1 y/o son, mother, and step-father.  She does not routinely exercise.     Review of Systems    General:  No chills, fever, night sweats or weight changes.  Cardiovascular:  +++ sharp/pleuritic chest pain.  No dyspnea on  exertion, edema, orthopnea, palpitations, paroxysmal nocturnal dyspnea. Dermatological: No rash, lesions/masses Respiratory: +++ cough, no dyspnea Urologic: No hematuria, dysuria Abdominal:   No nausea, vomiting, diarrhea, bright red blood per rectum, melena, or hematemesis Neurologic:  No visual changes, wkns, changes in mental status. All other systems reviewed and are otherwise negative except as noted above.  Physical Exam    Blood pressure 135/98, pulse 127, temperature 100.2 F (37.9 C), temperature source Oral, resp. rate 18, height 5' (1.524 m), weight 161 lb 9.6 oz (73.3 kg), SpO2 97 %.  General: Pleasant, NAD Psych: Normal affect. Neuro: Alert and oriented X 3. Moves all extremities spontaneously. HEENT: Normal  Neck: Supple without bruits or JVD. Lungs:  Resp regular and unlabored, CTA. Heart: RRR no s3, s4, or murmurs.  +++ Rub @ apex. Abdomen: Soft, non-tender, non-distended, BS + x 4.  Extremities: No clubbing, cyanosis or edema. DP/PT/Radials 2+ and equal bilaterally.  Labs     Lab Results  Component Value Date   WBC 12.8* 12/18/2015   HGB 10.3* 12/18/2015   HCT 31.1* 12/18/2015   MCV 87.6 12/18/2015   PLT 209 12/18/2015    Recent Labs Lab 12/18/15 0133  NA 138  K 3.8  CL 107  CO2 23  BUN 7  CREATININE 0.67  CALCIUM 8.1*  GLUCOSE 109*    Lab Results  Component Value Date   DDIMER 1.27* 12/16/2015     Radiology Studies    Dg Chest 2 View  12/16/2015  CLINICAL DATA:  Left-sided flank and chest pain. Shortness of breath for 3 days. Constipation. EXAM: CHEST - 2 VIEW COMPARISON:  Two-view chest x-ray 12/15/2015. FINDINGS: The heart size is normal. A new posterior left lower lobe pneumonia is present. The right lung is clear. There is no edema or effusion. The visualized soft tissues and bony thorax are unremarkable. IMPRESSION: 1. New left lower lobe pneumonia. Electronically Signed   By: Marin Roberts M.D.   On: 12/16/2015 20:48   Dg Chest 2  View  12/15/2015  CLINICAL DATA:  Chest pain with shortness of breath for 1 day. Dry cough. EXAM: CHEST  2 VIEW COMPARISON:  Chest radiograph June 18, 2015; chest CT June 18, 2015 FINDINGS: Lungs are clear. Heart size and pulmonary vascularity are normal. No adenopathy. No pneumothorax. No bone lesions. IMPRESSION: No edema or consolidation. Electronically Signed   By: Bretta Bang III M.D.   On: 12/15/2015 11:27   Ct Angio Chest Pe W/cm &/or Wo Cm  12/16/2015  CLINICAL DATA:  Acute onset of shortness of breath, vomiting  and left-sided chest pain. Initial encounter. EXAM: CT ANGIOGRAPHY CHEST WITH CONTRAST TECHNIQUE: Multidetector CT imaging of the chest was performed using the standard protocol during bolus administration of intravenous contrast. Multiplanar CT image reconstructions and MIPs were obtained to evaluate the vascular anatomy. CONTRAST:  70mL OMNIPAQUE IOHEXOL 350 MG/ML SOLN COMPARISON:  Chest radiograph performed earlier today at 8:35 p.m., and CTA of the chest performed 06/18/2015 FINDINGS: There is no evidence of pulmonary embolus. Trace bilateral pleural effusions are noted. Mild interstitial prominence is noted at both lung bases, concerning for minimal interstitial edema. There is no evidence of pneumothorax. No masses are identified; no abnormal focal contrast enhancement is seen. A small to moderate pericardial effusion is noted. The mediastinum is otherwise unremarkable. No mediastinal lymphadenopathy is seen. The great vessels are grossly unremarkable. No axillary lymphadenopathy is seen. The visualized portions of the thyroid gland are unremarkable in appearance. The visualized portions of the liver and spleen are unremarkable. No acute osseous abnormalities are seen. Review of the MIP images confirms the above findings. IMPRESSION: 1. No evidence of pulmonary embolus. 2. Trace bilateral pleural effusions noted. Mild interstitial prominence at both lung bases, concerning for  minimal interstitial edema. 3. Small to moderate pericardial effusion noted, of uncertain etiology. Would correlate clinically for any evidence of pericarditis. Electronically Signed   By: Roanna Raider M.D.   On: 12/16/2015 23:33    ECG & Cardiac Imaging    12/24:  Sinus tachycardia, 125, PR depression (PR interval 180 msec, down from 235 on 12/23).  2D Echocardiogram 12.25.2016  Study Conclusions  - Left ventricle: The cavity size was normal. Systolic function was   normal. Wall motion was normal; there were no regional wall   motion abnormalities. - Pericardium, extracardiac: A large, free-flowing pericardial   effusion was identified circumferential to the heart. The fluid   had no internal echoes.There was no evidence of hemodynamic   compromise. _____________   Assessment & Plan    1.  Acute (? On chronic) Pericarditis:  Pt with a h/o intermittent chest pain and prior ER visits with fairly marked inferior and anterolateral ST elevation dating back to at least 05/2015.  She developed sharp, pleuritic chest pain on 12/22 that worsened 12/23, for which she was seen in the ED and treated with ibuprofen and albuterol.  Ss persisted, prompting EMS call and [facility] eval.  ECG now with marked PR depression.  CT notable for pericardial effusion and echo shows nl LV with large, circumferential, free-flowing effusion w/ early tamponade physiology.  Her c/p is much improved on anti-inflammatory medications and she is hemodynamically stable.  Cont ibuprofen, colchicine, and PPI.  She will need prolonged course (3 months).  Given h/o ECG changes along with first degree AVB, must question role of possible connective tissue disorder and this should be worked up.  Consider cardiac MRI following lab eval for connective tissue disease.  2.  Tob/Marijuana Abuse:  Complete cessation advised.  3.  ? PNA:  Per IM.  Signed, Nicolasa Ducking, NP 12/18/2015, 12:20 PM  Patient seen and examined.  Additional  history obtained that she has really not eaten in 3 days.  Had been feeling poorly and was in the emergency room twice recently.  The most recent emergency room visit she was admitted for the one a couple of days before she came because she was constipated and also was having sharp chest pain and was sent home on ibuprofen.  Her history of an abnormal EKG dates back  to several months and she has had intermittent chest pain.  She has had some bilateral shoulder pain that she attributes to injury and also has some red bumps that appear on her face.  No other arthritis.  She does not have a rub or paradoxus on exam and her neck veins were flat.  Lungs were clear.  1.  Large pericardial effusion with clinical syndrome with acute pericarditis.  The abnormal EKG dating back several months as well as her long first-degree AV block make suspicious for a more chronic problem that has been indolent.  She has a large effusion now and has early right atrial collapse and slight decrease in her IVC collapse. 2.  Tobacco marijuana abuse 3.  Prolonged first-degree AV block on EKG  Recommendations:  Repeat echocardiogram tomorrow.  Avoid anticoagulation.  If her echocardiogram is stable we might consider home to get an early echo but she appears to be showing some early signs of pericardial tamponade.  Continue intravenous fluids at the present time.  Would not send home if she is tachycardic.

## 2015-12-18 NOTE — Progress Notes (Addendum)
[facility] PROGRESS NOTE  Niasia Lanphear ZOX:[id number] DOB: August 20, 1991 DOA: 12/16/2015 PCP: No PCP Per Patient  Assessment/Plan: 1. Acute likely Viral pericarditis -Echo yesterday with large pericardial effusion -has been on Ibuprofen and Colchicine x1day -No h/o SLE or RA in family -Cards consulted, d/w Dr.Tilley recommended Transfer to [facility] -BP stable but tachycardic since this am  2. Anxiety -Continue Valium  3. GERD -PPI  DVt proph: SCDs  Code Status: Full COde Family Communication: none at bedside Disposition Plan: Tx to [facility]   Consultants:  Cards-d/w Dr.Tilley  HPI/Subjective: Feels better, chest pain improved  Objective: Filed Vitals:   12/18/15 0139 12/18/15 0700  BP: 114/83 116/75  Pulse: 110 114  Temp: 98.4 F (36.9 C) 97 F (36.1 C)  Resp: 18 18    Intake/Output Summary (Last 24 hours) at 12/18/15 0757 Last data filed at 12/18/15 0525  Gross per 24 hour  Intake 1591.25 ml  Output      0 ml  Net 1591.25 ml   Filed Weights   12/17/15 0144  Weight: 73.3 kg (161 lb 9.6 oz)    Exam:   General:  AAOx3  Cardiovascular: S1S2/RRR  Respiratory: CTAB  Abdomen: soft, NT, BS present  Musculoskeletal: no edema c/c   Data Reviewed: Basic Metabolic Panel:  Recent Labs Lab 12/16/15 2015 12/17/15 0210 12/18/15 0133  NA 134* 137 138  K 3.8 3.8 3.8  CL 98* 103 107  CO2 GLUCOSE 163* 169* 109*  BUN 6 <5* 7  CREATININE 0.68 0.73 0.67  CALCIUM 9.1 7.9* 8.1*  MG  --  1.8  --    Liver Function Tests: No results for input(s): AST, ALT, ALKPHOS, BILITOT, PROT, ALBUMIN in the last 168 hours. No results for input(s): LIPASE, AMYLASE in the last 168 hours. No results for input(s): AMMONIA in the last 168 hours. CBC:  Recent Labs Lab 12/16/15 2015 12/17/15 0210 12/18/15 0133  WBC 18.4* 17.4* 12.8*  NEUTROABS  --  13.8*  --   HGB 12.5 11.3* 10.3*  HCT 37.6 33.5* 31.1*  MCV 86.8 85.7 87.6  PLT 248 199 209   Cardiac  Enzymes: No results for input(s): CKTOTAL, CKMB, CKMBINDEX, TROPONINI in the last 168 hours. BNP (last 3 results)  Recent Labs  12/17/15 0210  BNP 53.7    ProBNP (last 3 results) No results for input(s): PROBNP in the last 8760 hours.  CBG: No results for input(s): GLUCAP in the last 168 hours.  No results found for this or any previous visit (from the past 240 hour(s)).   Studies: Dg Chest 2 View  12/16/2015  CLINICAL DATA:  Left-sided flank and chest pain. Shortness of breath for 3 days. Constipation. EXAM: CHEST - 2 VIEW COMPARISON:  Two-view chest x-ray 12/15/2015. FINDINGS: The heart size is normal. A new posterior left lower lobe pneumonia is present. The right lung is clear. There is no edema or effusion. The visualized soft tissues and bony thorax are unremarkable. IMPRESSION: 1. New left lower lobe pneumonia. Electronically Signed   By: Marin Roberts M.D.   On: 12/16/2015 20:48   Ct Angio Chest Pe W/cm &/or Wo Cm  12/16/2015  CLINICAL DATA:  Acute onset of shortness of breath, vomiting and left-sided chest pain. Initial encounter. EXAM: CT ANGIOGRAPHY CHEST WITH CONTRAST TECHNIQUE: Multidetector CT imaging of the chest was performed using the standard protocol during bolus administration of intravenous contrast. Multiplanar CT image reconstructions and MIPs were obtained to evaluate the vascular anatomy. CONTRAST:  70mL OMNIPAQUE IOHEXOL 350 MG/ML SOLN COMPARISON:  Chest radiograph performed earlier today at 8:35 p.m., and CTA of the chest performed 06/18/2015 FINDINGS: There is no evidence of pulmonary embolus. Trace bilateral pleural effusions are noted. Mild interstitial prominence is noted at both lung bases, concerning for minimal interstitial edema. There is no evidence of pneumothorax. No masses are identified; no abnormal focal contrast enhancement is seen. A small to moderate pericardial effusion is noted. The mediastinum is otherwise unremarkable. No mediastinal  lymphadenopathy is seen. The great vessels are grossly unremarkable. No axillary lymphadenopathy is seen. The visualized portions of the thyroid gland are unremarkable in appearance. The visualized portions of the liver and spleen are unremarkable. No acute osseous abnormalities are seen. Review of the MIP images confirms the above findings. IMPRESSION: 1. No evidence of pulmonary embolus. 2. Trace bilateral pleural effusions noted. Mild interstitial prominence at both lung bases, concerning for minimal interstitial edema. 3. Small to moderate pericardial effusion noted, of uncertain etiology. Would correlate clinically for any evidence of pericarditis. Electronically Signed   By: Roanna RaiderJeffery  Chang M.D.   On: 12/16/2015 23:33    Scheduled Meds: . colchicine  0.6 mg Oral BID  . diazepam  5 mg Oral BID  . docusate sodium  100 mg Oral BID  . ibuprofen  600 mg Oral TID  . pantoprazole  40 mg Oral BID  . sodium chloride  3 mL Intravenous Q12H   Continuous Infusions: . sodium chloride 75 mL/hr at 12/18/15 0557   Antibiotics Given (last 72 hours)    None      Principal Problem:   Acute pericarditis Active Problems:   Tachycardia with 121 - 140 beats per minute   Hyperglycemia   Hyponatremia   Hypochloremia    Time spent: 35min    [facility] EdmondsJOSEPH,Ryian Lynde  [facility] Pager [phone]. If 7PM-7AM, please contact night-coverage at [email or web address], password [email or web address] 12/18/2015, 7:57 AM  LOS: 1 day

## 2015-12-18 NOTE — Progress Notes (Signed)
Patient took a shower after we told the patient she wasn't allowed to because we need to monitor her heart rate. Patients monitor is now connected.   Kathleen Hicks, [name] RN

## 2015-12-18 NOTE — Progress Notes (Signed)
[facility]. Complains of head and chest pain. Will medicate.   Vitals stable.   Starleen Trussell, [name] RN

## 2015-12-18 NOTE — Progress Notes (Signed)
Utilization Review Completed.Arienne Gartin T12/26/2016  

## 2015-12-19 ENCOUNTER — Inpatient Hospital Stay (HOSPITAL_COMMUNITY): Payer: Medicaid Other

## 2015-12-19 DIAGNOSIS — I319 Disease of pericardium, unspecified: Secondary | ICD-10-CM

## 2015-12-19 LAB — GLUCOSE, CAPILLARY: Glucose-Capillary: 126 mg/dL — ABNORMAL HIGH (ref 65–99)

## 2015-12-19 LAB — HEMOGLOBIN A1C
Hgb A1c MFr Bld: 5.7 % — ABNORMAL HIGH (ref 4.8–5.6)
Mean Plasma Glucose: 117 mg/dL

## 2015-12-19 MED ORDER — DIAZEPAM 5 MG PO TABS
ORAL_TABLET | ORAL | Status: AC
  Administered 2015-12-19: 5 mg
  Filled 2015-12-19: qty 1

## 2015-12-19 MED ORDER — DIAZEPAM 5 MG PO TABS
5.0000 mg | ORAL_TABLET | Freq: Three times a day (TID) | ORAL | Status: DC
  Administered 2015-12-19 – 2015-12-20 (×2): 5 mg via ORAL
  Filled 2015-12-19 (×2): qty 1

## 2015-12-19 NOTE — Progress Notes (Addendum)
Nursing note Patient upset with anxiety and questions about tests and procedure and hospital stay wants to speak with physician. Dr. Butler Denmarkizwan made aware will be up to see patient this afternoon. Patient made aware and verbalized understanding. Also  Patient requesting medication for anxiety, orders received for anxiety medication administered as ordered. Will continue to monitor patient. Atilla Zollner, Randall AnKristin Jessup RN

## 2015-12-19 NOTE — Progress Notes (Signed)
[facility] PROGRESS NOTE  Nickie RetortShani Calip NWG:[id number] DOB: 08/03/1991 DOA: 12/16/2015 PCP: No PCP Per Patient    History of present illness  24 y/o female without a significant prior cardiac history. Review of her records does show that she's been seen in the ER for prior bouts of chest pain and also has an abnl ECG with marked inferior and anterolateral ST elevation along with a first degree AV block dating back to at least 05/2015.   She smoked 3-4 cigarettes along with marijuana daily. Last week, her son came down with cold symptoms and [facility]. She says that he was treated with inhalers and given a Rx for oral steroids. On Thursday 12/22, Ms. Mat CarneClay began to experience sharp midsternal chest pain that was worse with deep breathing, coughing, and lying flat. She also noted night sweats from 12/22 to 12/23.  [facility] where CXR was non-acute. ECG was again notable for ST elevation and 1st degree AVB. No labs were drawn. She was treated with albuterol and ibuprofen and d/c'd from the ED. Unfortunately, her chest pain did not improve and so she called EMS on 12/24. [facility] labs were notable for leukocytosis (18.4) and D dimer was elevated. A CT of the chest was performed and was negative for PE but notable for bilat pleural effusion and a small to moderate pericardial effusion. CXR suggested new LLL PNA. ECG again showed marked, diffuse ST elevation with more pronounced PR depression. She was admitted by IM and initially placed on Abx along with nsaids and colchicine for pericarditis. She has had improvement in c/p since starting anti-inflammatory therapy. Echo on 12/25 showed a large, free-flowing, circumferential pericardial effusion w early tamponade physiology. [facility].    Assessment/Plan: 1. Acute likely Viral pericarditis -Echo yesterday with large pericardial  effusion -has been on Ibuprofen and Colchicine x1day -No h/o SLE or RA in family -Cards consulted, d/w Dr.Tilley recommended EKG consistent with   PR depression Chest pain improved with colchicine and NSAIDs Consider cardiac MRI, pending workup for connective tissue diseases including lupus Repeat 2-D echo to rule out pericardial tamponade, avoid anticoagulation Cannot discharge home as the patient continues to be tachycardic   2. Anxiety -Continue Valium   3. GERD -PPI  DVt proph: SCDs  Code Status: Full COde Family Communication: none at bedside Disposition Plan: per cardiology   Consultants:  Cards-d/w Dr.Tilley  HPI/Subjective: Feels better, chest pain improved, had diarrhea this am    Objective: Filed Vitals:   12/18/15 2000 12/19/15 0611  BP: 135/84 131/97  Pulse: 116 106  Temp: 99.4 F (37.4 C) 98.7 F (37.1 C)  Resp: 18 18    Intake/Output Summary (Last 24 hours) at 12/19/15 0707 Last data filed at 12/18/15 1846  Gross per 24 hour  Intake    240 ml  Output      0 ml  Net    240 ml   Filed Weights   12/17/15 0144  Weight: 73.3 kg (161 lb 9.6 oz)    Exam:   General:  AAOx3  Cardiovascular: S1S2/RRR  Respiratory: CTAB  Abdomen: soft, NT, BS present  Musculoskeletal: no edema c/c   Data Reviewed: Basic Metabolic Panel:  Recent Labs Lab 12/16/15 2015 12/17/15 0210 12/18/15 0133  NA 134* 137 138  K 3.8 3.8 3.8  CL 98* 103 107  CO2 24 25 23  GLUCOSE 163* 169* 109*  BUN 6 <5* 7  CREATININE 0.68 0.73 0.67  CALCIUM 9.1 7.9* 8.1*  MG  --  1.8  --    Liver Function Tests: No results for input(s): AST, ALT, ALKPHOS, BILITOT, PROT, ALBUMIN in the last 168 hours. No results for input(s): LIPASE, AMYLASE in the last 168 hours. No results for input(s): AMMONIA in the last 168 hours. CBC:  Recent Labs Lab 12/16/15 2015 12/17/15 0210 12/18/15 0133  WBC 18.4* 17.4* 12.8*  NEUTROABS  --  13.8*  --   HGB 12.5 11.3* 10.3*  HCT 37.6  33.5* 31.1*  MCV 86.8 85.7 87.6  PLT 248 199 209   Cardiac Enzymes: No results for input(s): CKTOTAL, CKMB, CKMBINDEX, TROPONINI in the last 168 hours. BNP (last 3 results)  Recent Labs  12/17/15 0210  BNP 53.7    ProBNP (last 3 results) No results for input(s): PROBNP in the last 8760 hours.  CBG:  Recent Labs Lab 12/18/15 0817 12/19/15 0646  GLUCAP 93 126*    No results found for this or any previous visit (from the past 240 hour(s)).   Studies: No results found.  Scheduled Meds: . colchicine  0.6 mg Oral BID  . diazepam  5 mg Oral BID  . docusate sodium  100 mg Oral BID  . ibuprofen  600 mg Oral TID  . pantoprazole  40 mg Oral BID  . sodium chloride  3 mL Intravenous Q12H   Continuous Infusions: . sodium chloride 75 mL/hr at 12/19/15 0513   Antibiotics Given (last 72 hours)    None      Principal Problem:   Acute pericarditis Active Problems:   Tachycardia with 121 - 140 beats per minute   Hyperglycemia   Hyponatremia   Hypochloremia    Time spent:    [facility]  [facility] Pager [phone] If 7PM-7AM, please contact night-coverage at [email or web address], password [email or web address] 12/19/2015, 7:07 AM  LOS: 2 days

## 2015-12-19 NOTE — Progress Notes (Signed)
Patient moving around in room, boyfriend at bedside. No distress, some pain, will wait to bedtime for any medication. Call light within reach.

## 2015-12-19 NOTE — Progress Notes (Signed)
Subjective:  States that she is feeling better.  She is not having any chest pain or shortness of breath at the present time.    Objective:  Vital Signs in the last 24 hours: BP 123/88 mmHg  Pulse 98  Temp(Src) 98.4 F (36.9 C) (Oral)  Resp 17  Ht 5' (1.524 m)  Wt 73.3 kg (161 lb 9.6 oz)  BMI 31.56 kg/m2  SpO2 99%  LMP  (Within Weeks)  Physical Exam: Mildly obese black female in no acute distress  Lungs:  Clear  Cardiac:  Regular rhythm, normal S1 and S2, no S3 no rub or murmur noted Extremities:  No edema present  Intake/Output from previous day: 12/26 0701 - 12/27 0700 In: 240 [P.O.:240] Out: -  Weight Filed Weights   12/17/15 0144  Weight: 73.3 kg (161 lb 9.6 oz)    Lab Results: Basic Metabolic Panel:  Recent Labs  16/09/9611/25/16 0210 12/18/15 0133  NA 137 138  K 3.8 3.8  CL 103 107  CO2 25 23  GLUCOSE 169* 109*  BUN <5* 7  CREATININE 0.73 0.67   CBC:  Recent Labs  12/17/15 0210 12/18/15 0133  WBC 17.4* 12.8*  NEUTROABS 13.8*  --   HGB 11.3* 10.3*  HCT 33.5* 31.1*  MCV 85.7 87.6  PLT 199 209   BNP    Component Value Date/Time   BNP 53.7 12/17/2015 0210   Repeat echocardiogram Moderate to large effusion but no tamponade  Telemetry: Sinus rhythm and sinus tachycardia.  Assessment/Plan:    1.  Acute pericarditis with moderate to large pericardial effusion without evidence of pericardial tamponade-the etiology of systems still unknown.  Sedimentation rate is elevated and she is anemic.  Serologic tests for lupus are still pending. 2.  Moderate anemia  Recommendations:  Okay to discharge from cardiac viewpoint.  She will need to continue colchicine 0.6 mg twice daily for at least 3 months and ibuprofen 600 mg 3 times a day with food for a few weeks until the inflammation resolves.  She will need to have follow-up with primary care on discharge to evaluate anemia and other medical problems.  She will need follow-up with me in my office on January  9 and we will schedule an echo at the time of that visit follow-up the effusion.     Darden PalmerW. Spencer Issa Luster, Jr.  MD [facility]  12/19/2015, 5:45 PM

## 2015-12-19 NOTE — Progress Notes (Signed)
Patient ambulated in hallway independently back in room will monitor patient. Kerem Gilmer Jessup RN  

## 2015-12-19 NOTE — Progress Notes (Signed)
Nutrition Brief Note  Patient identified on the Malnutrition Screening Tool (MST) Report.  Wt Readings from Last 15 Encounters:  12/17/15 161 lb 9.6 oz (73.3 kg)  07/05/15 150 lb (68.04 kg)  04/05/15 147 lb (66.679 kg)    Body mass index is 31.56 kg/(m^2). Patient meets criteria for Obesity Class I based on current BMI.   Current diet order is Regular; pt reports a good appetite.  Labs and medications reviewed.   No nutrition interventions warranted at this time. If nutrition issues arise, please consult RD.   Maureen ChattersKatie Lajuane Leatham, RD, LDN Pager #: [phone] After-Hours Pager #: [phone]

## 2015-12-19 NOTE — Progress Notes (Signed)
Echocardiogram 2D Echocardiogram has been performed.  Dorothey BasemanReel, Karolyne Timmons M 12/19/2015, 2:07 PM

## 2015-12-20 DIAGNOSIS — E876 Hypokalemia: Secondary | ICD-10-CM

## 2015-12-20 DIAGNOSIS — D649 Anemia, unspecified: Secondary | ICD-10-CM

## 2015-12-20 LAB — CBC
HCT: 27.9 % — ABNORMAL LOW (ref 36.0–46.0)
HEMOGLOBIN: 9.1 g/dL — AB (ref 12.0–15.0)
MCH: 28.3 pg (ref 26.0–34.0)
MCHC: 32.6 g/dL (ref 30.0–36.0)
MCV: 86.9 fL (ref 78.0–100.0)
PLATELETS: 244 10*3/uL (ref 150–400)
RBC: 3.21 MIL/uL — ABNORMAL LOW (ref 3.87–5.11)
RDW: 13.1 % (ref 11.5–15.5)
WBC: 6.6 10*3/uL (ref 4.0–10.5)

## 2015-12-20 LAB — GLUCOSE, CAPILLARY: GLUCOSE-CAPILLARY: 94 mg/dL (ref 65–99)

## 2015-12-20 LAB — COMPREHENSIVE METABOLIC PANEL
ALBUMIN: 2.5 g/dL — AB (ref 3.5–5.0)
ALT: 15 U/L (ref 14–54)
ANION GAP: 8 (ref 5–15)
AST: 15 U/L (ref 15–41)
Alkaline Phosphatase: 56 U/L (ref 38–126)
BILIRUBIN TOTAL: 0.2 mg/dL — AB (ref 0.3–1.2)
BUN: <5 mg/dL — ABNORMAL LOW (ref 6–20)
CO2: 26 mmol/L (ref 22–32)
Calcium: 8.3 mg/dL — ABNORMAL LOW (ref 8.9–10.3)
Chloride: 106 mmol/L (ref 101–111)
Creatinine, Ser: 0.72 mg/dL (ref 0.44–1.00)
GFR calc Af Amer: >60 mL/min (ref >60)
GFR calc non Af Amer: >60 mL/min (ref >60)
GLUCOSE: 100 mg/dL — AB (ref 65–99)
POTASSIUM: 3.3 mmol/L — AB (ref 3.5–5.1)
Sodium: 140 mmol/L (ref 135–145)
TOTAL PROTEIN: 5 g/dL — AB (ref 6.5–8.1)

## 2015-12-20 LAB — ANTINUCLEAR ANTIBODIES, IFA: ANA Ab, IFA: NEGATIVE

## 2015-12-20 MED ORDER — IBUPROFEN 600 MG PO TABS: 600.0000 mg | ORAL_TABLET | Freq: Three times a day (TID) | ORAL | Status: DC

## 2015-12-20 MED ORDER — COLCHICINE 0.6 MG PO TABS: 0.6000 mg | ORAL_TABLET | Freq: Two times a day (BID) | ORAL | Status: DC

## 2015-12-20 MED ORDER — PANTOPRAZOLE SODIUM 40 MG PO TBEC: 40.0000 mg | DELAYED_RELEASE_TABLET | Freq: Two times a day (BID) | ORAL | Status: DC

## 2015-12-20 MED ORDER — POTASSIUM CHLORIDE CRYS ER 20 MEQ PO TBCR
40.0000 meq | EXTENDED_RELEASE_TABLET | Freq: Once | ORAL | Status: AC
  Administered 2015-12-20: 40 meq via ORAL
  Filled 2015-12-20: qty 2

## 2015-12-20 NOTE — Discharge Summary (Signed)
Physician Discharge Summary  Kathleen RetortShani Hinderliter ZOX:[id number] DOB: 01/27/1991 DOA: 12/16/2015  PCP: No PCP Per Patient  Admit date: 12/16/2015 Discharge date: 12/20/2015  Time spent: 35 minutes  Recommendations for Outpatient Follow-up:  1. CBC 1 week with PCP on medicaid card-- patient to call with appointment 2. Echo Jan 9th   Discharge Diagnoses:  Principal Problem:   Acute pericarditis Active Problems:   Tachycardia with 121 - 140 beats per minute   Hyperglycemia   Hyponatremia   Hypochloremia   Discharge Condition: improved  Diet recommendation: regular  Filed Weights   12/17/15 0144  Weight: 73.3 kg (161 lb 9.6 oz)    History of present illness:  Kathleen Hicks is a generally-well 24 y.o. female who presents with SOB, non-productive cough, and left-sided CP of 2-3 days duration. Endorses concomitant chills and subjective fevers last 2 nights. Dyspnea worse with exertion, relieved somewhat with rest. Chest pain sharp, left-sided, and with radiation to left shoulder. Chest pain not worse with exertion and not notably changed with body position. She denies palpitations, edema, calf tenderness, hemoptysis, long-distance travel, or sick contacts.   In ED, patient was found to have temp 37.8 C, HR in 120-130 range, RR in the 20s, and normotension. CXR suggested a new LLL PNA and leukocytosis to 18k was noted on CBC. She was bolused 2 L NS and treated empirically with azithro and Rocephin. EKG was obtained in the ED and revealed ST-elevations in diffuse leads with some PR-depressions. A d-dimer returned elevated and CTA chest, while negative for PE, demonstrated a circumfrential pericardial effusion with trace bilateral pleural effusions. No pneumonia was suggested by CTA chest.   Hospital Course:  Acute pericarditis with moderate to large pericardial effusion without evidence of pericardial tamponade colchicine 0.6 mg twice daily for at least 3 months and ibuprofen 600 mg 3 times a day  with food for a few weeks until the inflammation resolves. If she continues to have diarrhea, the colchicine dose will need to be reduced. -ANA pending -cardiology follow up Jan 9th (Dr. Donnie Ahoilley)  Anemia -CBC outoatient  Hypokalemia -replace      Procedures:  echo  Consultations:  cards  Discharge Exam: Filed Vitals:   12/19/15 2021 12/20/15 0514  BP: 131/91 116/80  Pulse: 99 90  Temp: 98.9 F (37.2 C) 97.3 F (36.3 C)  Resp: 18 18    General: awake, NAD, anxious to go home   Discharge Instructions   Discharge Instructions    Diet - low sodium heart healthy    Complete by:  As directed      Discharge instructions    Complete by:  As directed   Follow up with Dr. Donnie Ahoilley on Jan 9th Take ibuprofen TID WITH MEALS Watch for diarrhea- report to Dr. Donnie Ahoilley if having or PCP Eat food rich in potassium--- OJ, potatoes, tomatoes, bananas     Increase activity slowly    Complete by:  As directed           Current Discharge Medication List    START taking these medications   Details  colchicine 0.6 MG tablet Take 1 tablet (0.6 mg total) by mouth 2 (two) times daily. Qty: 60 tablet, Refills: 2    pantoprazole (PROTONIX) 40 MG tablet Take 1 tablet (40 mg total) by mouth 2 (two) times daily. Qty: 60 tablet, Refills: 0      CONTINUE these medications which have CHANGED   Details  ibuprofen (ADVIL,MOTRIN) 600 MG tablet Take 1 tablet (600 mg  total) by mouth 3 (three) times daily. Qty: 90 tablet, Refills: 0      CONTINUE these medications which have NOT CHANGED   Details  diazepam (VALIUM) 5 MG tablet Take 1 tablet (5 mg total) by mouth 2 (two) times daily. Qty: 10 tablet, Refills: 0    oxyCODONE-acetaminophen (PERCOCET) 7.5-325 MG tablet Take 1 Tab by mouth three times a day PRF PAIN Refills: 0       No Known Allergies Follow-up Information    Follow up with W Viann Fish, MD.   Specialty:  Cardiology   Why:  To come to office on January 9th Monday  for repeat ECHO and visit   Contact information:   [address] [place] [address] [phone]       Please follow up.   Why:  PCP on medicaid card in 2 weeks       The results of significant diagnostics from this hospitalization (including imaging, microbiology, ancillary and laboratory) are listed below for reference.    Significant Diagnostic Studies: Dg Chest 2 View  12/16/2015  CLINICAL DATA:  Left-sided flank and chest pain. Shortness of breath for 3 days. Constipation. EXAM: CHEST - 2 VIEW COMPARISON:  Two-view chest x-ray 12/15/2015. FINDINGS: The heart size is normal. A new posterior left lower lobe pneumonia is present. The right lung is clear. There is no edema or effusion. The visualized soft tissues and bony thorax are unremarkable. IMPRESSION: 1. New left lower lobe pneumonia. Electronically Signed   By: Marin Roberts M.D.   On: 12/16/2015 20:48   Dg Chest 2 View  12/15/2015  CLINICAL DATA:  Chest pain with shortness of breath for 1 day. Dry cough. EXAM: CHEST  2 VIEW COMPARISON:  Chest radiograph June 18, 2015; chest CT June 18, 2015 FINDINGS: Lungs are clear. Heart size and pulmonary vascularity are normal. No adenopathy. No pneumothorax. No bone lesions. IMPRESSION: No edema or consolidation. Electronically Signed   By: Bretta Bang III M.D.   On: 12/15/2015 11:27   Ct Angio Chest Pe W/cm &/or Wo Cm  12/16/2015  CLINICAL DATA:  Acute onset of shortness of breath, vomiting and left-sided chest pain. Initial encounter. EXAM: CT ANGIOGRAPHY CHEST WITH CONTRAST TECHNIQUE: Multidetector CT imaging of the chest was performed using the standard protocol during bolus administration of intravenous contrast. Multiplanar CT image reconstructions and MIPs were obtained to evaluate the vascular anatomy. CONTRAST:  70mL OMNIPAQUE IOHEXOL 350 MG/ML SOLN COMPARISON:  Chest radiograph performed earlier today at 8:35 p.m., and CTA of the chest performed  06/18/2015 FINDINGS: There is no evidence of pulmonary embolus. Trace bilateral pleural effusions are noted. Mild interstitial prominence is noted at both lung bases, concerning for minimal interstitial edema. There is no evidence of pneumothorax. No masses are identified; no abnormal focal contrast enhancement is seen. A small to moderate pericardial effusion is noted. The mediastinum is otherwise unremarkable. No mediastinal lymphadenopathy is seen. The great vessels are grossly unremarkable. No axillary lymphadenopathy is seen. The visualized portions of the thyroid gland are unremarkable in appearance. The visualized portions of the liver and spleen are unremarkable. No acute osseous abnormalities are seen. Review of the MIP images confirms the above findings. IMPRESSION: 1. No evidence of pulmonary embolus. 2. Trace bilateral pleural effusions noted. Mild interstitial prominence at both lung bases, concerning for minimal interstitial edema. 3. Small to moderate pericardial effusion noted, of uncertain etiology. Would correlate clinically for any evidence of pericarditis. Electronically Signed   By:  Roanna Raider M.D.   On: 12/16/2015 23:33    Microbiology: No results found for this or any previous visit (from the past 240 hour(s)).   Labs: Basic Metabolic Panel:  Recent Labs Lab 12/16/15 2015 12/17/15 0210 12/18/15 0133 12/20/15 0339  NA 134* 137 138 140  K 3.8 3.8 3.8 3.3*  CL 98* 103 107 106  CO2 GLUCOSE 163* 169* 109* 100*  BUN 6 <5* 7 <5*  CREATININE 0.68 0.73 0.67 0.72  CALCIUM 9.1 7.9* 8.1* 8.3*  MG  --  1.8  --   --    Liver Function Tests:  Recent Labs Lab 12/20/15 0339  AST 15  ALT 15  ALKPHOS 56  BILITOT 0.2*  PROT 5.0*  ALBUMIN 2.5*   No results for input(s): LIPASE, AMYLASE in the last 168 hours. No results for input(s): AMMONIA in the last 168 hours. CBC:  Recent Labs Lab 12/16/15 2015 12/17/15 0210 12/18/15 0133 12/20/15 0339  WBC 18.4*  17.4* 12.8* 6.6  NEUTROABS  --  13.8*  --   --   HGB 12.5 11.3* 10.3* 9.1*  HCT 37.6 33.5* 31.1* 27.9*  MCV 86.8 85.7 87.6 86.9  PLT 248 199 209 244   Cardiac Enzymes: No results for input(s): CKTOTAL, CKMB, CKMBINDEX, TROPONINI in the last 168 hours. BNP: BNP (last 3 results)  Recent Labs  12/17/15 0210  BNP 53.7    ProBNP (last 3 results) No results for input(s): PROBNP in the last 8760 hours.  CBG:  Recent Labs Lab 12/18/15 0817 12/19/15 0646  GLUCAP 93 126*       Signed:  Briannon Boggio U Twan Harkin DO  [facility] 12/20/2015, 9:50 AM

## 2015-12-20 NOTE — Progress Notes (Signed)
Pt is stable. Pt has been educated on the importance of medication compliance and follow up appointments. Pt has agreed to discharge teaching. Telemetry has been discontinued.

## 2015-12-20 NOTE — Care Management Note (Signed)
Case Management Note  Patient Details  Name: Kathleen Hicks MRN: [id number] Date of Birth: 10/20/1991  Subjective/Objective:      Pt admitted with acute pericarditis     Action/Plan:   Pt is from home with mom.  Pt assessed prior to discharge; no CM needs determined   Expected Discharge Date:                  Expected Discharge Plan:  Home/Self Care  In-House Referral:     Discharge planning Services  CM Consult  Post Acute Care Choice:    Choice offered to:     DME Arranged:    DME Agency:     HH Arranged:    HH Agency:     Status of Service:  Completed, signed off  Medicare Important Message Given:    Date Medicare IM Given:    Medicare IM give by:    Date Additional Medicare IM Given:    Additional Medicare Important Message give by:     If discussed at Long Length of Stay Meetings, dates discussed:    Additional Comments:  Cherylann ParrClaxton, Lovelace Cerveny S, RN 12/20/2015, 10:44 AM

## 2015-12-20 NOTE — Progress Notes (Signed)
Subjective:  Having a mild amount of diarrhea.  She is anxious to go home.  Lab today shows some hypokalemia as well as hypoalbuminemia.  Her ANA is still pending.  Objective:  Vital Signs in the last 24 hours: BP 116/80 mmHg  Pulse 90  Temp(Src) 97.3 F (36.3 C) (Oral)  Resp 18  Ht 5' (1.524 m)  Wt 73.3 kg (161 lb 9.6 oz)  BMI 31.56 kg/m2  SpO2 85%  LMP  (Within Weeks)  Physical Exam: Mildly obese black female in no acute distress  Lungs:  Clear  Cardiac:  Regular rhythm, normal S1 and S2, no S3 no rub or murmur noted Extremities:  No edema present  Intake/Output from previous day: 12/27 0701 - 12/28 0700 In: 758 [P.O.:240; I.V.:518] Out: -  Weight Filed Weights   12/17/15 0144  Weight: 73.3 kg (161 lb 9.6 oz)    Lab Results: Basic Metabolic Panel:  Recent Labs  45/40/9812/26/16 0133 12/20/15 0339  NA 138 140  K 3.8 3.3*  CL 107 106  CO2 23 26  GLUCOSE 109* 100*  BUN 7 <5*  CREATININE 0.67 0.72   CBC:  Recent Labs  12/18/15 0133 12/20/15 0339  WBC 12.8* 6.6  HGB 10.3* 9.1*  HCT 31.1* 27.9*  MCV 87.6 86.9  PLT 209 244   BNP    Component Value Date/Time   BNP 53.7 12/17/2015 0210   Repeat echocardiogram Moderate to large effusion but no tamponade yesterday  Telemetry: Sinus rhythm and sinus tachycardia.  Assessment/Plan:  1.  Acute pericarditis with moderate to large pericardial effusion without evidence of pericardial tamponade-the etiology of systems still unknown.  Sedimentation rate is elevated and she is anemic.  Serologic tests for lupus are still pending. 2.  Anemia with further drop in hemoglobin since admission 3.  Hypoalbuminemia 4.  Hypokalemia  Recommendations:  Okay to discharge from cardiac viewpoint.  She will need to continue colchicine 0.6 mg twice daily for at least 3 months and ibuprofen 600 mg 3 times a day with food for a few weeks until the inflammation resolves.  If she continues to have diarrhea, the colchicine dose will  need to be reduced.  Need to watch the anemia.  It'll be imperative that she have general medical follow-up soon after admission because of the anemia and to evaluate the other problems.  She will need follow-up with me in my office on January 9 and we will schedule an echo at the time of that visit follow-up the effusion.      Darden PalmerW. Spencer Alroy Portela, Jr.  MD [facility]  12/20/2015, 9:09 AM

## 2016-03-12 ENCOUNTER — Emergency Department (HOSPITAL_COMMUNITY)
Admission: EM | Admit: 2016-03-12 | Discharge: 2016-03-12 | Disposition: A | Discharge status: Home / Self Care | Payer: Medicaid Other | Attending: Emergency Medicine | Admitting: Emergency Medicine

## 2016-03-12 ENCOUNTER — Emergency Department (HOSPITAL_COMMUNITY): Payer: Medicaid Other

## 2016-03-12 ENCOUNTER — Encounter (HOSPITAL_COMMUNITY): Payer: Self-pay | Admitting: Emergency Medicine

## 2016-03-12 DIAGNOSIS — J111 Influenza due to unidentified influenza virus with other respiratory manifestations: Secondary | ICD-10-CM | POA: Insufficient documentation

## 2016-03-12 DIAGNOSIS — F17210 Nicotine dependence, cigarettes, uncomplicated: Secondary | ICD-10-CM | POA: Diagnosis not present

## 2016-03-12 DIAGNOSIS — R079 Chest pain, unspecified: Secondary | ICD-10-CM | POA: Diagnosis present

## 2016-03-12 DIAGNOSIS — J45909 Unspecified asthma, uncomplicated: Secondary | ICD-10-CM | POA: Diagnosis not present

## 2016-03-12 DIAGNOSIS — Z79899 Other long term (current) drug therapy: Secondary | ICD-10-CM | POA: Insufficient documentation

## 2016-03-12 DIAGNOSIS — R000 Tachycardia, unspecified: Secondary | ICD-10-CM | POA: Diagnosis not present

## 2016-03-12 DIAGNOSIS — F419 Anxiety disorder, unspecified: Secondary | ICD-10-CM | POA: Diagnosis not present

## 2016-03-12 DIAGNOSIS — R69 Illness, unspecified: Secondary | ICD-10-CM

## 2016-03-12 MED ORDER — HYDROCODONE-HOMATROPINE 5-1.5 MG/5ML PO SYRP: 5.0000 mL | ORAL_SOLUTION | Freq: Four times a day (QID) | ORAL | Status: DC | PRN

## 2016-03-12 MED ORDER — IBUPROFEN 600 MG PO TABS: 600.0000 mg | ORAL_TABLET | Freq: Four times a day (QID) | ORAL | Status: DC | PRN

## 2016-03-12 NOTE — ED Notes (Signed)
Spoke with charge RN - patient still has 129 HR.  History of pericardial effusion and pericarditis.   Patient will be going to acute side once child is seen/treated/discharged.

## 2016-03-12 NOTE — ED Provider Notes (Signed)
CSN: [id number]     Arrival date & time 03/12/16  0818 History   First MD Initiated Contact with Patient 03/12/16 [phone]     Chief Complaint  Patient presents with  . flu like symptoms      (Consider location/radiation/quality/duration/timing/severity/associated sxs/prior Treatment) HPI Patient reports she started with subjective chills and cough Sunday. She has not been measuring her temperature. Cough is occasionally productive of some clear sputum. She is however denying any chest pain or shortness of breath. Patient reports that over the past weeks and months, she has been able to be active at her baseline interacting with her child, climbing stairs with no exertional dyspnea or chest pain. She states since her episode of pericarditis for which she was hospitalized, she is felt much better and not had recurrence of symptoms. She reports she does not feel like she is having no symptoms now. Her symptoms started with nasal congestion, drainage and mild sore throat. She denies any rash, lower extremity swelling or pain. She reports her child has the same illness. Past Medical History  Diagnosis Date  . Anxiety   . Tobacco abuse   . Childhood asthma    Past Surgical History  Procedure Laterality Date  . No past surgeries     Family History  Problem Relation Age of Onset  . Lupus Neg Hx   . Other Mother     alive and well  . Hypertension Maternal Grandmother   . Diabetes Maternal Grandmother   . Kidney failure Maternal Grandmother     died in her 2950's.   Social History  Substance Use Topics  . Smoking status: Current Every Day Smoker -- 0.25 packs/day for 6 years    Types: Cigarettes  . Smokeless tobacco: None  . Alcohol Use: 0.0 oz/week    0 Standard drinks or equivalent per week     Comment: Occasional drink/social.   OB History    No data available     Review of Systems 10 Systems reviewed and are negative for acute change except as noted in the HPI.    Allergies   Review of patient's allergies indicates no known allergies.  Home Medications   Prior to Admission medications   Medication Sig Start Date End Date Taking? Authorizing Provider  colchicine 0.6 MG tablet Take 1 tablet (0.6 mg total) by mouth 2 (two) times daily. 12/20/15   Joseph ArtJessica U Vann, DO  diazepam (VALIUM) 5 MG tablet Take 1 tablet (5 mg total) by mouth 2 (two) times daily. 04/05/15   Antony MaduraKelly Humes, PA-C  HYDROcodone-homatropine [facility](HYCODAN) 5-1.5 MG/5ML syrup Take 5 mLs by mouth every 6 (six) hours as needed for cough. 03/12/16   Arby BarretteMarcy Zyire Eidson, MD  ibuprofen (ADVIL,MOTRIN) 600 MG tablet Take 1 tablet (600 mg total) by mouth 3 (three) times daily. 12/20/15   Joseph ArtJessica U Vann, DO  ibuprofen (ADVIL,MOTRIN) 600 MG tablet Take 1 tablet (600 mg total) by mouth every 6 (six) hours as needed. 03/12/16   Arby BarretteMarcy Evelen Vazguez, MD  oxyCODONE-acetaminophen (PERCOCET) 7.5-325 MG tablet Take 1 Tab by mouth three times a day PRF PAIN 11/27/15   Historical Provider, MD  pantoprazole (PROTONIX) 40 MG tablet Take 1 tablet (40 mg total) by mouth 2 (two) times daily. 12/20/15   Jessica U Vann, DO   BP 115/90 mmHg  Pulse 93  Temp(Src) 99.8 F (37.7 C) (Oral)  Resp 20  SpO2 96% Physical Exam  Constitutional: She is oriented to person, place, and time. She appears well-developed and  well-nourished.  HENT:  Head: Normocephalic and atraumatic.  Nose: Nose normal.  Mouth/Throat: Oropharynx is clear and moist. No oropharyngeal exudate.  Eyes: EOM are normal. Pupils are equal, round, and reactive to light.  Neck: Neck supple. No thyromegaly present.  Cardiovascular: Regular rhythm, normal heart sounds and intact distal pulses.   Tachycardia. No appreciable rub or murmur.  Pulmonary/Chest: Effort normal and breath sounds normal. No respiratory distress. She has no wheezes. She has no rales.  Abdominal: Soft. Bowel sounds are normal. She exhibits no distension. There is no tenderness.  Musculoskeletal: Normal range of  motion. She exhibits no edema or tenderness.  Lymphadenopathy:    She has no cervical adenopathy.  Neurological: She is alert and oriented to person, place, and time. She has normal strength. She exhibits normal muscle tone. Coordination normal. GCS eye subscore is 4. GCS verbal subscore is 5. GCS motor subscore is 6.  Skin: Skin is warm, dry and intact.  Psychiatric: She has a normal mood and affect.    ED Course  Procedures (including critical care time) Labs Review Labs Reviewed - No data to display  Imaging Review Dg Chest 2 View  03/12/2016  CLINICAL DATA:  Cough and chills EXAM: CHEST  2 VIEW COMPARISON:  12/16/2015 FINDINGS: The heart size and mediastinal contours are within normal limits. Both lungs are clear. The visualized skeletal structures are unremarkable. IMPRESSION: No active cardiopulmonary disease. Electronically Signed   By: Alcide Clever M.D.   On: 03/12/2016 11:43   I have personally reviewed and evaluated these images and lab results as part of my medical decision-making.   EKG Interpretation None      MDM   Final diagnoses:  Influenza-like illness   Patient's clinically well in appearance. She denies any the symptoms she was previously experiencing when she had pericarditis. Symptom profiles consistent with viral illness. She is also being seen with her young son who has the same symptoms. It was reinforced the importance of follow-up and completion of any diagnostic evaluation related to her prior episode of pericarditis. A she did not pursue any follow-ups after her hospitalization. She is counseled on signs and symptoms for which to return. Patient is prescribed for ibuprofen and Hycodan.    Arby Barrette, MD 03/12/16 [phone]

## 2016-03-12 NOTE — ED Notes (Signed)
Patient here by EMS for fever, chills, productive cough x 1 day.   Patient states has been taking over the counter cold/cough medicine with no relief.

## 2016-03-12 NOTE — Discharge Instructions (Signed)
Suspected Influenza, Adult  Although you do not have symptoms at this time, you still must schedule a primary care physician follow-up regarding her prior episode of pericarditis. We discussed how important it is that you complete follow-ups and monitoring of your general medical condition even though you are feeling improved at this time. Currently, your symptoms are most suggestive of a viral infection. There is a large amount of influenza in the community at this time and your symptoms are influenza-like. Continue ibuprofen for fever and body aches. You may nighttime cough.use Hycodan as prescribed for cough.   Influenza ("the flu") is a viral infection of the respiratory tract. It occurs more often in winter months because people spend more time in close contact with one another. Influenza can make you feel very sick. Influenza easily spreads from person to person (contagious). CAUSES  Influenza is caused by a virus that infects the respiratory tract. You can catch the virus by breathing in droplets from an infected person's cough or sneeze. You can also catch the virus by touching something that was recently contaminated with the virus and then touching your mouth, nose, or eyes. RISKS AND COMPLICATIONS You may be at risk for a more severe case of influenza if you smoke cigarettes, have diabetes, have chronic heart disease (such as heart failure) or lung disease (such as asthma), or if you have a weakened immune system. Elderly people and pregnant women are also at risk for more serious infections. The most common problem of influenza is a lung infection (pneumonia). Sometimes, this problem can require emergency medical care and may be life threatening. SIGNS AND SYMPTOMS  Symptoms typically last 4 to 10 days and may include:  Fever.  Chills.  Headache, body aches, and muscle aches.  Sore throat.  Chest discomfort and cough.  Poor appetite.  Weakness or feeling  tired.  Dizziness.  Nausea or vomiting. DIAGNOSIS  Diagnosis of influenza is often made based on your history and a physical exam. A nose or throat swab test can be done to confirm the diagnosis. TREATMENT  In mild cases, influenza goes away on its own. Treatment is directed at relieving symptoms. For more severe cases, your health care provider may prescribe antiviral medicines to shorten the sickness. Antibiotic medicines are not effective because the infection is caused by a virus, not by bacteria. HOME CARE INSTRUCTIONS  Take medicines only as directed by your health care provider.  Use a cool mist humidifier to make breathing easier.  Get plenty of rest until your temperature returns to normal. This usually takes 3 to 4 days.  Drink enough fluid to keep your urine clear or pale yellow.  Cover yourmouth and nosewhen coughing or sneezing,and wash your handswellto prevent thevirusfrom spreading.  Stay homefromwork orschool untilthe fever is gonefor at least 121full day. PREVENTION  An annual influenza vaccination (flu shot) is the best way to avoid getting influenza. An annual flu shot is now routinely recommended for all adults in the [place] SEEK MEDICAL CARE IF:  You experiencechest pain, yourcough worsens,or you producemore mucus.  Youhave nausea,vomiting, ordiarrhea.  Your fever returns or gets worse. SEEK IMMEDIATE MEDICAL CARE IF:  You havetrouble breathing, you become short of breath,or your skin ornails becomebluish.  You have severe painor stiffnessin the neck.  You develop a sudden headache, or pain in the face or ear.  You have nausea or vomiting that you cannot control. MAKE SURE YOU:   Understand these instructions.  Will watch your condition.  Will get help right away if you are not doing well or get worse.   This information is not intended to replace advice given to you by your health care provider. Make sure you discuss any  questions you have with your health care provider.   Document Released: 12/06/2000 Document Revised: 12/30/2014 Document Reviewed: 03/09/2012 Elsevier Interactive Patient Education 2016 Elsevier Inc[employer] Upper Respiratory Infection, Adult Most upper respiratory infections (URIs) are a viral infection of the air passages leading to the lungs. A URI affects the nose, throat, and upper air passages. The most common type of URI is nasopharyngitis and is typically referred to as "the common cold." URIs run their course and usually go away on their own. Most of the time, a URI does not require medical attention, but sometimes a bacterial infection in the upper airways can follow a viral infection. This is called a secondary infection. Sinus and middle ear infections are common types of secondary upper respiratory infections. Bacterial pneumonia can also complicate a URI. A URI can worsen asthma and chronic obstructive pulmonary disease (COPD). Sometimes, these complications can require emergency medical care and may be life threatening.  CAUSES Almost all URIs are caused by viruses. A virus is a type of germ and can spread from one person to another.  RISKS FACTORS You may be at risk for a URI if:   You smoke.   You have chronic heart or lung disease.  You have a weakened defense (immune) system.   You are very young or very old.   You have nasal allergies or asthma.  You work in crowded or poorly ventilated areas.  You work in health care facilities or schools. SIGNS AND SYMPTOMS  Symptoms typically develop 2-3 days after you come in contact with a cold virus. Most viral URIs last 7-10 days. However, viral URIs from the influenza virus (flu virus) can last 14-18 days and are typically more severe. Symptoms may include:   Runny or stuffy (congested) nose.   Sneezing.   Cough.   Sore throat.   Headache.   Fatigue.   Fever.   Loss of appetite.   Pain in your forehead,  behind your eyes, and over your cheekbones (sinus pain).  Muscle aches.  DIAGNOSIS  Your health care provider may diagnose a URI by:  Physical exam.  Tests to check that your symptoms are not due to another condition such as:  Strep throat.  Sinusitis.  Pneumonia.  Asthma. TREATMENT  A URI goes away on its own with time. It cannot be cured with medicines, but medicines may be prescribed or recommended to relieve symptoms. Medicines may help:  Reduce your fever.  Reduce your cough.  Relieve nasal congestion. HOME CARE INSTRUCTIONS   Take medicines only as directed by your health care provider.   Gargle warm saltwater or take cough drops to comfort your throat as directed by your health care provider.  Use a warm mist humidifier or inhale steam from a shower to increase air moisture. This may make it easier to breathe.  Drink enough fluid to keep your urine clear or pale yellow.   Eat soups and other clear broths and maintain good nutrition.   Rest as needed.   Return to work when your temperature has returned to normal or as your health care provider advises. You may need to stay home longer to avoid infecting others. You can also use a face mask and careful hand washing to prevent spread of the virus.  Increase the usage of your inhaler if you have asthma.   Do not use any tobacco products, including cigarettes, chewing tobacco, or electronic cigarettes. If you need help quitting, ask your health care provider. PREVENTION  The best way to protect yourself from getting a cold is to practice good hygiene.   Avoid oral or hand contact with people with cold symptoms.   Wash your hands often if contact occurs.  There is no clear evidence that vitamin C, vitamin E, echinacea, or exercise reduces the chance of developing a cold. However, it is always recommended to get plenty of rest, exercise, and practice good nutrition.  SEEK MEDICAL CARE IF:   You are getting  worse rather than better.   Your symptoms are not controlled by medicine.   You have chills.  You have worsening shortness of breath.  You have brown or red mucus.  You have yellow or brown nasal discharge.  You have pain in your face, especially when you bend forward.  You have a fever.  You have swollen neck glands.  You have pain while swallowing.  You have white areas in the back of your throat. SEEK IMMEDIATE MEDICAL CARE IF:   You have severe or persistent:  Headache.  Ear pain.  Sinus pain.  Chest pain.  You have chronic lung disease and any of the following:  Wheezing.  Prolonged cough.  Coughing up blood.  A change in your usual mucus.  You have a stiff neck.  You have changes in your:  Vision.  Hearing.  Thinking.  Mood. MAKE SURE YOU:   Understand these instructions.  Will watch your condition.  Will get help right away if you are not doing well or get worse.   This information is not intended to replace advice given to you by your health care provider. Make sure you discuss any questions you have with your health care provider.   Document Released: 06/04/2001 Document Revised: 04/25/2015 Document Reviewed: 03/16/2014 Elsevier Interactive Patient Education [employer]

## 2016-06-06 ENCOUNTER — Ambulatory Visit (HOSPITAL_COMMUNITY)
Admission: EM | Admit: 2016-06-06 | Discharge: 2016-06-06 | Disposition: A | Discharge status: Home / Self Care | Payer: No Typology Code available for payment source | Attending: Emergency Medicine | Admitting: Emergency Medicine

## 2016-06-06 ENCOUNTER — Encounter (HOSPITAL_COMMUNITY): Payer: Self-pay | Admitting: Emergency Medicine

## 2016-06-06 DIAGNOSIS — R609 Edema, unspecified: Secondary | ICD-10-CM

## 2016-06-06 DIAGNOSIS — R600 Localized edema: Secondary | ICD-10-CM

## 2016-06-06 NOTE — ED Provider Notes (Signed)
HPI  SUBJECTIVE:  Nickie RetortShani Lisenby is a 25 y.o. female who presents with bilateral hands and feet swelling that started yesterday. Patient states that she woke up and she was unable to close her hands due to the edema. She took some ibuprofen and Benadryl with complete resolution her symptoms. There are no other aggravating factors. She has not tried anything else for this. No pain, erythema, numbness, tingling, lower extremity edema, fevers, trauma to her hands or feet. She does not take any other medications other than alprazolam daily. She denies any change in her meds. No chest pain, short of breath, coughing or wheezing. Past medical history negative for kidney disease, hypertension, diabetes, CHF, edema. She has a past medical history of anxiety and asthma. LMP: 5/25 denies possibility pregnant. PMD: Dr. Barkley BoardsMcKinzie Wayland. She states that she works 7 days a week, and is requesting a work note for only today. States that she wishes to go back to work tomorrow   Past Medical History  Diagnosis Date  . Anxiety   . Tobacco abuse   . Childhood asthma     Past Surgical History  Procedure Laterality Date  . No past surgeries      Family History  Problem Relation Age of Onset  . Lupus Neg Hx   . Other Mother     alive and well  . Hypertension Maternal Grandmother   . Diabetes Maternal Grandmother   . Kidney failure Maternal Grandmother     died in her 4550's.    Social History  Substance Use Topics  . Smoking status: Current Every Day Smoker -- 0.25 packs/day for 6 years    Types: Cigarettes  . Smokeless tobacco: None  . Alcohol Use: 0.0 oz/week    0 Standard drinks or equivalent per week     Comment: Occasional drink/social.    No current facility-administered medications for this encounter.  Current outpatient prescriptions:  .  diphenhydrAMINE (BENADRYL) 25 mg capsule, Take 25 mg by mouth every 6 (six) hours as needed., Disp: , Rfl:  .  ibuprofen (ADVIL,MOTRIN) 600 MG tablet, Take  1 tablet (600 mg total) by mouth every 6 (six) hours as needed., Disp: 30 tablet, Rfl: 0 .  colchicine 0.6 MG tablet, Take 1 tablet (0.6 mg total) by mouth 2 (two) times daily., Disp: 60 tablet, Rfl: 2 .  diazepam (VALIUM) 5 MG tablet, Take 1 tablet (5 mg total) by mouth 2 (two) times daily., Disp: 10 tablet, Rfl: 0 .  pantoprazole (PROTONIX) 40 MG tablet, Take 1 tablet (40 mg total) by mouth 2 (two) times daily., Disp: 60 tablet, Rfl: 0  No Known Allergies   ROS  As noted in HPI.   Physical Exam  BP 110/83 mmHg  Pulse 73  Temp(Src) 98 F (36.7 C) (Oral)  Resp 12  SpO2 98%  LMP 05/16/2016  Constitutional: Well developed, well nourished, no acute distress Eyes:  EOMI, conjunctiva normal bilaterally HENT: Normocephalic, atraumatic,mucus membranes moist Respiratory: Normal inspiratory effort Cardiovascular: Normal rate GI: nondistended skin: No rash, skin intact Musculoskeletal: no deformities. No appreciable edema in the hands or feet. No bony tenderness in the wrists, hands, distal bilateral lower extremities, feet. Cap refill less than 2 seconds. Sensation and motor grossly intact.  Neurologic: Alert & oriented x 3, no focal neuro deficits Psychiatric: Speech and behavior appropriate   ED Course   Medications - No data to display  No orders of the defined types were placed in this encounter.    No  results found for this or any previous visit (from the past 24 hour(s)). No results found.  ED Clinical Impression  Hand edema  Edema of foot   ED Assessment/Plan  requesting a work note for today. States that she does not want tomorrow off. She is currently  asymptomatic, states that the Benadryl and Advil have helped her symptoms significantly. Unable to appreciate any edema here on physical exam. Advised nonsedating antihistamines during the day, Benadryl at night, continue ibuprofen as needed. Follow-up with her primary care physician as needed. Advised plenty of  fluids, rest, elevation, compression hose.  Discussed medical decision-making and plan for follow-up with patient. Patient  agrees with plan.  *This clinic note was created using Dragon dictation software. Therefore, there may be occasional mistakes despite careful proofreading.  ?   Domenick Gong, MD 06/06/16 1745

## 2016-06-06 NOTE — ED Notes (Signed)
Reports hand and foot swelling that started yesterday.  Patient works every day of the week.  Patient wanting a doctors note today.  Patient reports swelling occurs frequently 12- 16 hours a day

## 2016-12-04 ENCOUNTER — Emergency Department (HOSPITAL_COMMUNITY)
Admission: EM | Admit: 2016-12-04 | Discharge: 2016-12-04 | Disposition: A | Discharge status: Home / Self Care | Payer: Medicaid Other | Attending: Dermatology | Admitting: Dermatology

## 2016-12-04 ENCOUNTER — Encounter (HOSPITAL_COMMUNITY): Payer: Self-pay | Admitting: Nurse Practitioner

## 2016-12-04 DIAGNOSIS — J45909 Unspecified asthma, uncomplicated: Secondary | ICD-10-CM | POA: Diagnosis not present

## 2016-12-04 DIAGNOSIS — F17210 Nicotine dependence, cigarettes, uncomplicated: Secondary | ICD-10-CM | POA: Insufficient documentation

## 2016-12-04 DIAGNOSIS — Z5321 Procedure and treatment not carried out due to patient leaving prior to being seen by health care provider: Secondary | ICD-10-CM | POA: Diagnosis not present

## 2016-12-04 DIAGNOSIS — R0981 Nasal congestion: Secondary | ICD-10-CM | POA: Insufficient documentation

## 2016-12-04 NOTE — ED Triage Notes (Signed)
Pt presents with c/o nasal congestion. The symptoms began 2 days ago. She reports head and neck ache, cough, sneezing, itchy watery eyes, sore throat. She denies fevers. She has been using halls cough drops with some relief.

## 2016-12-04 NOTE — ED Notes (Signed)
Pt left without being seen.

## 2016-12-04 NOTE — ED Notes (Signed)
Called pt with no response 

## 2017-03-21 IMAGING — DX DG CHEST 2V
2 series · 2 of 2 positions shown · non-contrast
Comparison: None.

CLINICAL DATA: Midsternal chest pain 2 days with pleuritic pain.
Smoker.

EXAM:
CHEST  2 VIEW

[chest pa]
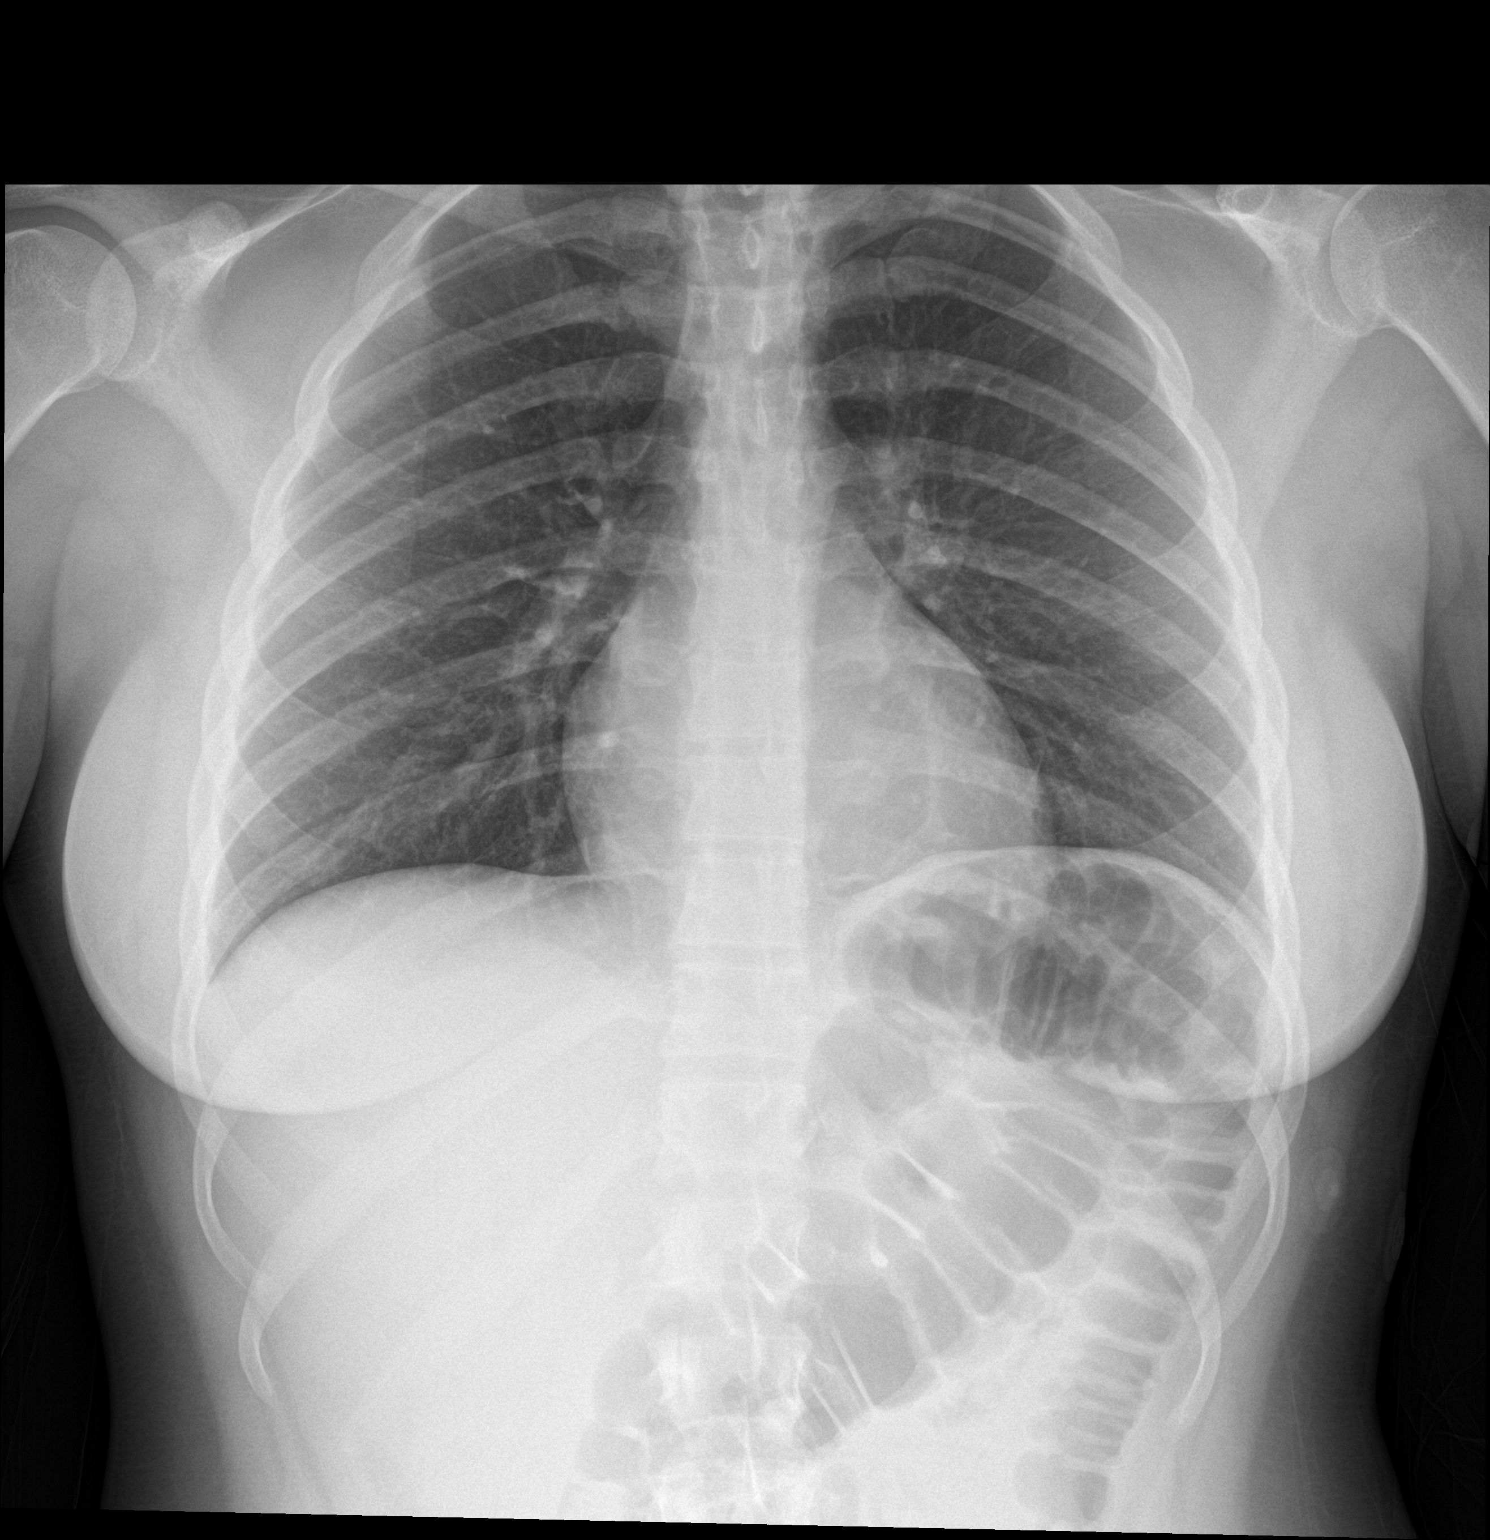

[chest lat]
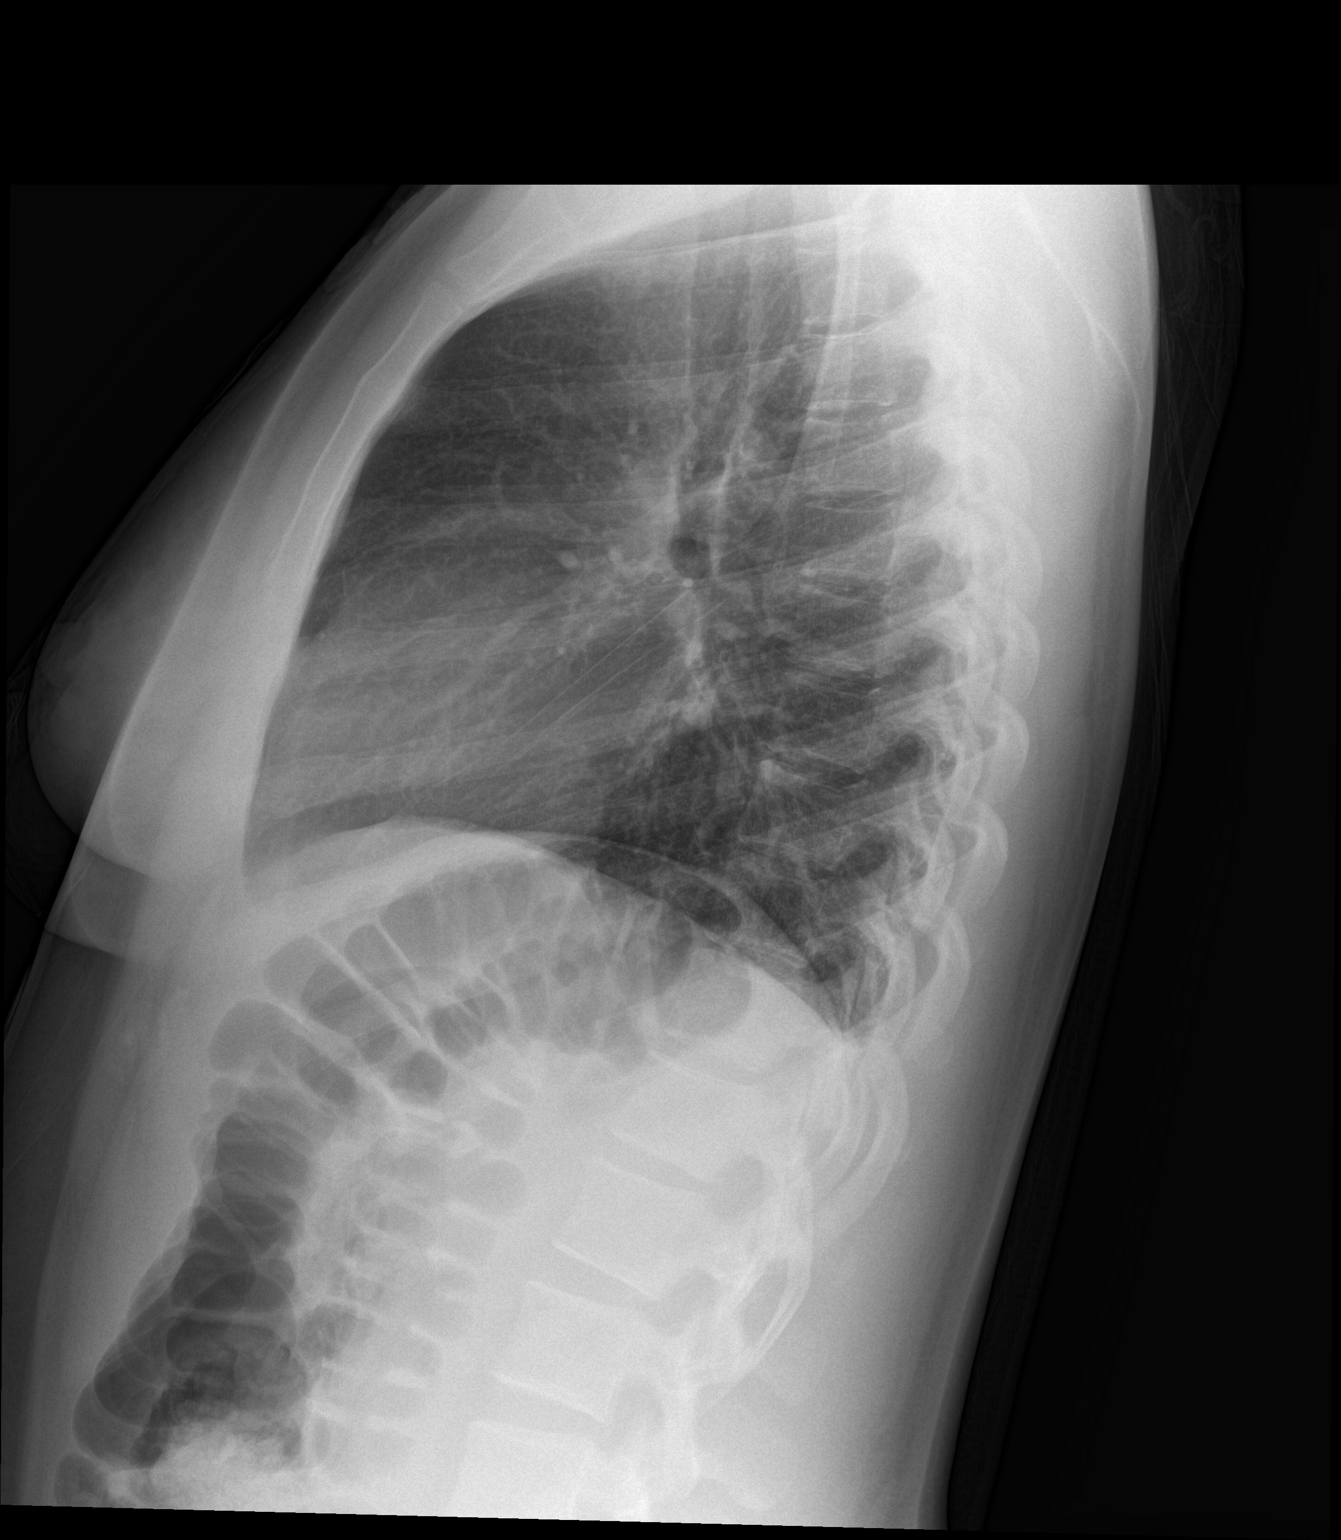

[2 of 2 positions shown; findings below may reference images not displayed]

FINDINGS: Lungs are adequately inflated without consolidation or effusion.
Cardiomediastinal silhouette is within normal. Remaining bones and
soft tissues are normal.
IMPRESSION: No active cardiopulmonary disease.

## 2017-11-15 ENCOUNTER — Emergency Department (HOSPITAL_COMMUNITY)
Admission: EM | Admit: 2017-11-15 | Discharge: 2017-11-15 | Disposition: A | Discharge status: Home / Self Care | Payer: Medicaid Other | Attending: Emergency Medicine | Admitting: Emergency Medicine

## 2017-11-15 ENCOUNTER — Encounter (HOSPITAL_COMMUNITY): Payer: Self-pay | Admitting: Emergency Medicine

## 2017-11-15 ENCOUNTER — Other Ambulatory Visit: Payer: Self-pay

## 2017-11-15 DIAGNOSIS — B9689 Other specified bacterial agents as the cause of diseases classified elsewhere: Secondary | ICD-10-CM

## 2017-11-15 DIAGNOSIS — F17210 Nicotine dependence, cigarettes, uncomplicated: Secondary | ICD-10-CM | POA: Insufficient documentation

## 2017-11-15 DIAGNOSIS — Z79899 Other long term (current) drug therapy: Secondary | ICD-10-CM | POA: Diagnosis not present

## 2017-11-15 DIAGNOSIS — Z113 Encounter for screening for infections with a predominantly sexual mode of transmission: Secondary | ICD-10-CM | POA: Diagnosis not present

## 2017-11-15 DIAGNOSIS — R110 Nausea: Secondary | ICD-10-CM | POA: Insufficient documentation

## 2017-11-15 DIAGNOSIS — N760 Acute vaginitis: Secondary | ICD-10-CM | POA: Diagnosis not present

## 2017-11-15 DIAGNOSIS — R109 Unspecified abdominal pain: Secondary | ICD-10-CM | POA: Diagnosis present

## 2017-11-15 LAB — CBC WITH DIFFERENTIAL/PLATELET
BASOS PCT: 0 %
Basophils Absolute: 0 10*3/uL (ref 0.0–0.1)
EOS ABS: 0.1 10*3/uL (ref 0.0–0.7)
Eosinophils Relative: 1 %
HEMATOCRIT: 37.4 % (ref 36.0–46.0)
Hemoglobin: 12.5 g/dL (ref 12.0–15.0)
Lymphocytes Relative: 31 %
Lymphs Abs: 3.4 10*3/uL (ref 0.7–4.0)
MCH: 29.3 pg (ref 26.0–34.0)
MCHC: 33.4 g/dL (ref 30.0–36.0)
MCV: 87.8 fL (ref 78.0–100.0)
MONO ABS: 0.6 10*3/uL (ref 0.1–1.0)
MONOS PCT: 6 %
Neutro Abs: 6.9 10*3/uL (ref 1.7–7.7)
Neutrophils Relative %: 62 %
Platelets: 222 10*3/uL (ref 150–400)
RBC: 4.26 MIL/uL (ref 3.87–5.11)
RDW: 13.4 % (ref 11.5–15.5)
WBC: 11.1 10*3/uL — ABNORMAL HIGH (ref 4.0–10.5)

## 2017-11-15 LAB — WET PREP, GENITAL
SPERM: NONE SEEN
Trich, Wet Prep: NONE SEEN
Yeast Wet Prep HPF POC: NONE SEEN

## 2017-11-15 LAB — COMPREHENSIVE METABOLIC PANEL
ALBUMIN: 3.7 g/dL (ref 3.5–5.0)
ALT: 13 U/L — AB (ref 14–54)
AST: 16 U/L (ref 15–41)
Alkaline Phosphatase: 57 U/L (ref 38–126)
Anion gap: 7 (ref 5–15)
BUN: 10 mg/dL (ref 6–20)
CHLORIDE: 106 mmol/L (ref 101–111)
CO2: 24 mmol/L (ref 22–32)
Calcium: 8.7 mg/dL — ABNORMAL LOW (ref 8.9–10.3)
Creatinine, Ser: 1.06 mg/dL — ABNORMAL HIGH (ref 0.44–1.00)
GFR calc Af Amer: >60 mL/min (ref >60)
GFR calc non Af Amer: >60 mL/min (ref >60)
GLUCOSE: 119 mg/dL — AB (ref 65–99)
POTASSIUM: 3.8 mmol/L (ref 3.5–5.1)
Sodium: 137 mmol/L (ref 135–145)
Total Bilirubin: 0.5 mg/dL (ref 0.3–1.2)
Total Protein: 6.1 g/dL — ABNORMAL LOW (ref 6.5–8.1)

## 2017-11-15 LAB — URINALYSIS, ROUTINE W REFLEX MICROSCOPIC
BILIRUBIN URINE: NEGATIVE
Glucose, UA: NEGATIVE mg/dL
HGB URINE DIPSTICK: NEGATIVE
KETONES UR: NEGATIVE mg/dL
Leukocytes, UA: NEGATIVE
NITRITE: NEGATIVE
PH: 7 (ref 5.0–8.0)
Protein, ur: NEGATIVE mg/dL
SPECIFIC GRAVITY, URINE: 1.016 (ref 1.005–1.030)

## 2017-11-15 LAB — POC URINE PREG, ED: PREG TEST UR: NEGATIVE

## 2017-11-15 LAB — LIPASE, BLOOD: LIPASE: 20 U/L (ref 11–51)

## 2017-11-15 MED ORDER — METRONIDAZOLE 500 MG PO TABS: 500.0000 mg | ORAL_TABLET | Freq: Two times a day (BID) | ORAL | 0 refills | Status: DC

## 2017-11-15 MED ORDER — STERILE WATER FOR INJECTION IJ SOLN
INTRAMUSCULAR | Status: AC
  Administered 2017-11-15: 2.1 mL
  Filled 2017-11-15: qty 10

## 2017-11-15 MED ORDER — CEFTRIAXONE SODIUM 250 MG IJ SOLR
250.0000 mg | Freq: Once | INTRAMUSCULAR | Status: AC
  Administered 2017-11-15: 250 mg via INTRAMUSCULAR
  Filled 2017-11-15: qty 250

## 2017-11-15 MED ORDER — AZITHROMYCIN 250 MG PO TABS
1000.0000 mg | ORAL_TABLET | Freq: Once | ORAL | Status: AC
  Administered 2017-11-15: 1000 mg via ORAL
  Filled 2017-11-15: qty 4

## 2017-11-15 NOTE — Discharge Instructions (Signed)
Please read and follow all provided instructions.  Today you were cultured for a sexually transmitted disease like chlamydia or gonorrhea. You have elected to be treated at this time as a precaution. Results of your gonorrhea and chlamydia tests are pending and you will be notified if they are positive. Please refrain from sexual activity for 48 hours  It is very important to practice safe sex and use condoms when sexually active. If the test is positive, please refrain from sexual activity for 10 days for the medicine to take effect and notify your sexual partners for the last 6 months as they, too, may want to be tested. The website https://garcia.net/http://www.dontspreadit.com/ can be used to send anonymous text messages or emails to alert sexual contacts.  Because you have had unprotected sex, you may want to consider getting tested for HIV (and Syphilis) as well. If you did not elect to be tested for HIV or Syphilis (blood tests) in the department today, you can follow up at the Health Department for further testing for this. Remember that latex condoms or abstinence are the only way to prevent against STDs or HIV.  If you should develop severe or worsening pain in your abdomen or the pelvis or develop severe fevers, nausea or vomiting that prevent you from taking your medications, return to the emergency department immediately. Otherwise contact your local physician or county health department for a follow up appointment to complete STD testing including HIV and syphilis. I have attached the information for the health department.   You also were found to have Bacterial Vaginosis during today's visit for which I am prescribing your flagyl.  Do not drink alcohol while taking Flagyl as it can cause her to become very ill.  Gonorrhea and Chlamydia SYMPTOMS  In females, symptoms may go unnoticed. Symptoms that are more noticeable can include:  Belly (abdominal) pain.  Painful intercourse.  Watery mucous-like discharge  from the vagina.  Miscarriage.  Discomfort when urinating.  Inflammation of the rectum.  Abnormal gray-green frothy vaginal discharge  Vaginal itching and irritation  Itching and irritation of the area outside the vagina.   Painful urination.  Bleeding after sexual intercourse.  It can cause longstanding (chronic) pelvic pain after frequent infections.  TREATMENT  It is important to finish ALL medications given to you.  If you have Gonorrhea or Chlamydia it can leave to PID, which can cause women to not be able to have children (sterile) if left untreated or if half-treated.   This is a sexually transmitted infection. So you are also at risk for other sexually transmitted diseases, including HIV (AIDS), it is recommended that you get tested as described above. HOME CARE INSTRUCTIONS  Warning: This infection is contagious. Do not have sex until treatment is completed. Follow up at your caregiver's office or the clinic to which you were referred. If your diagnosis (learning what is wrong) is confirmed by culture or some other method, your recent sexual contacts need treatment. Even if they are symptom free or have a negative culture or evaluation, they should be treated.  PREVENTION  Practice safe sex, use condoms, have only one sex partner and be sure your sex partner is not having sex with others.  Ask your caregiver to test you for Gonorrhea and Chlamydia at your regular checkups or sooner if you are having symptoms.  Ask for further information if you are pregnant.  SEEK IMMEDIATE MEDICAL CARE IF:  You develop an oral temperature above 102 F (38.9  C), not controlled by medications or lasting more than 2 days.  You develop an increase in pain.  You develop any type of abnormal discharge.  You develop vaginal bleeding and it is not time for your period.  You develop painful intercourse.  Additional Information:  Your vital signs today were: BP 139/85 (BP Location: Right Arm)    Pulse  83    Temp 98.4 F (36.9 C) (Oral)    Resp 18    Ht 5' (1.524 m)    Wt 72.6 kg (160 lb)    LMP 09/26/2017    SpO2 100%    BMI 31.25 kg/m  If your blood pressure (BP) was elevated above 135/85 this visit, please have this repeated by your doctor within one month. ---------------

## 2017-11-15 NOTE — ED Provider Notes (Signed)
[facility] EMERGENCY DEPARTMENT Provider Note   CSN: [id number] Arrival date & time: 11/15/17  2001     History   Chief Complaint Chief Complaint  Patient presents with  . Abdominal Pain    HPI Kathleen Hicks is a 26 y.o. female who presents the emergency department today for concerns of STDs.  Patient states that her partner was notified he may have been in contact with somebody that has gonorrhea/chlamydia.  She is currently asymptomatic without any vaginal pain, vaginal discharge, vaginal bleeding, vaginal pruritus.  No rashes, ulcers or lesions.  Patient has no history of STDs.  She is currently sexually active with one partner and does not use protection.  She also states that over the last 1 week she has had some abdominal pain.  She states 2 episodes of sharp, suprapubic abdominal pain that lasted for approximately 20 seconds that occurred shortly after intercourse.  No pain during sex.  She notes some associated nausea.  No current abdominal pain.  She denies any fever, chills, emesis, diarrhea, constipation, hematochezia, melena, urinary frequency, hematuria, urgency, dysuria or flank pain.  Last mental period 10/16/2017.  HPI  Past Medical History:  Diagnosis Date  . Anxiety   . Childhood asthma   . Tobacco abuse     Patient Active Problem List   Diagnosis Date Noted  . Acute pericarditis 12/17/2015  . Tachycardia with 121 - 140 beats per minute 12/17/2015  . Hyperglycemia 12/17/2015  . Hyponatremia 12/17/2015  . Hypochloremia 12/17/2015    Past Surgical History:  Procedure Laterality Date  . NO PAST SURGERIES      OB History    No data available       Home Medications    Prior to Admission medications   Medication Sig Start Date End Date Taking? Authorizing Provider  colchicine 0.6 MG tablet Take 1 tablet (0.6 mg total) by mouth 2 (two) times daily. 12/20/15   Joseph Art, DO  diazepam (VALIUM) 5 MG tablet Take 1 tablet (5 mg total)  by mouth 2 (two) times daily. 04/05/15   Antony Madura, PA-C  diphenhydrAMINE (BENADRYL) 25 mg capsule Take 25 mg by mouth every 6 (six) hours as needed.    [provider]  ibuprofen (ADVIL,MOTRIN) 600 MG tablet Take 1 tablet (600 mg total) by mouth every 6 (six) hours as needed. 03/12/16   Arby Barrette, MD  pantoprazole (PROTONIX) 40 MG tablet Take 1 tablet (40 mg total) by mouth 2 (two) times daily. 12/20/15   Joseph Art, DO    Family History Family History  Problem Relation Age of Onset  . Other Mother        alive and well  . Hypertension Maternal Grandmother   . Diabetes Maternal Grandmother   . Kidney failure Maternal Grandmother        died in her 7's.  . Lupus Neg Hx     Social History Social History   Tobacco Use  . Smoking status: Current Every Day Smoker    Packs/day: 0.25    Years: 6.00    Pack years: 1.50    Types: Cigarettes  . Smokeless tobacco: Never Used  Substance Use Topics  . Alcohol use: Yes    Alcohol/week: 0.0 oz    Comment: Occasional drink/social.  . Drug use: No    Comment: denies     Allergies   Patient has no known allergies.   Review of Systems Review of Systems  All other systems  reviewed and are negative.    Physical Exam Updated Vital Signs BP 139/85 (BP Location: Right Arm)   Pulse 83   Temp 98.4 F (36.9 C) (Oral)   Resp 18   Ht 5' (1.524 m)   Wt 72.6 kg (160 lb)   LMP 09/26/2017   SpO2 100%   BMI 31.25 kg/m   Physical Exam  Constitutional: She appears well-developed and well-nourished.  HENT:  Head: Normocephalic and atraumatic.  Right Ear: External ear normal.  Left Ear: External ear normal.  Nose: Nose normal.  Mouth/Throat: Uvula is midline, oropharynx is clear and moist and mucous membranes are normal. No tonsillar exudate.  Eyes: Pupils are equal, round, and reactive to light. Right eye exhibits no discharge. Left eye exhibits no discharge. No scleral icterus.  Neck: Trachea normal. Neck  supple. No spinous process tenderness present. No neck rigidity. Normal range of motion present.  Cardiovascular: Normal rate, regular rhythm and intact distal pulses.  No murmur heard. Pulses:      Radial pulses are 2+ on the right side, and 2+ on the left side.       Dorsalis pedis pulses are 2+ on the right side, and 2+ on the left side.       Posterior tibial pulses are 2+ on the right side, and 2+ on the left side.  No lower extremity swelling or edema. Calves symmetric in size bilaterally.  Pulmonary/Chest: Effort normal and breath sounds normal. She exhibits no tenderness.  Abdominal: Soft. Bowel sounds are normal. There is no tenderness. There is no rigidity, no rebound, no guarding and no CVA tenderness.  Genitourinary:  Genitourinary Comments: Exam performed by Jacinto HalimMichael M Tranise Forrest, exam chaperoned Pelvic exam: normal external genitalia without evidence of trauma. VULVA: normal appearing vulva with no masses, tenderness or lesion. VAGINA: normal appearing vagina with normal color and discharge, no lesions. CERVIX: normal appearing cervix without lesions, cervical motion tenderness absent, cervical os closed with out purulent discharge. Wet prep and DNA probe for chlamydia and GC obtained.   ADNEXA: normal adnexa in size, nontender and no masses UTERUS: uterus is normal size, shape, consistency and nontender.   Musculoskeletal: She exhibits no edema.  Lymphadenopathy:    She has no cervical adenopathy.  Neurological: She is alert.  Skin: Skin is warm and dry. No rash noted. She is not diaphoretic.  Psychiatric: She has a normal mood and affect.  Nursing note and vitals reviewed.    ED Treatments / Results  Labs (all labs ordered are listed, but only abnormal results are displayed) Labs Reviewed  WET PREP, GENITAL  URINALYSIS, ROUTINE W REFLEX MICROSCOPIC  RPR  HIV ANTIBODY (ROUTINE TESTING)  COMPREHENSIVE METABOLIC PANEL  CBC WITH DIFFERENTIAL/PLATELET  LIPASE, BLOOD  POC  URINE PREG, ED  GC/CHLAMYDIA PROBE AMP ([place]    EKG  EKG Interpretation None       Radiology No results found.  Procedures Procedures (including critical care time)  Medications Ordered in ED Medications  cefTRIAXone (ROCEPHIN) injection 250 mg (not administered)  azithromycin (ZITHROMAX) tablet 1,000 mg (not administered)     Initial Impression / Assessment and Plan / ED Course  I have reviewed the triage vital signs and the nursing notes.  Pertinent labs & imaging results that were available during my care of the patient were reviewed by me and considered in my medical decision making (see chart for details).     Pt presents with concerns for possible STD.  Pt understands  that they have GC/Chlamydia cultures pending and that they will need to inform all sexual partners if results return positive. HIV and RPR also sent. Pt has been treated prophylactically with azithromycin and Rocephin due to pts history.Pt not concerning for PID because hemodynamically stable and no cervical motion tenderness on pelvic exam. Pt has also been treated with Flagyl for Bacterial Vaginosis. Pt has been advised to not drink alcohol while on this medication.   Patient here also for abdominal pain that she describes as suprapubic abdominal pain lasting 20 seconds shortly after intercourse.  Currently without abdominal pain.  Vital signs are reassuring without fever, tachycardia, tachypnea, hypoxia or hypotension.  Abdominal exam soft, nontender without peritoneal signs.  Pelvic exam benign.  No cervical motion tenderness or adnexal tenderness.  UA without signs of UTI.  Pregnancy test negative.  Blood work reassuring. Patient to be discharged with instructions to follow up with OBGYN/PCP/local health department. Discussed importance of using protection when sexually active. Return precautions discussed. Patient appears safe for discharge.    Final Clinical Impressions(s) / ED  Diagnoses   Final diagnoses:  Screening for STD (sexually transmitted disease)  Bacterial vaginosis    ED Discharge Orders        Ordered    metroNIDAZOLE (FLAGYL) 500 MG tablet  2 times daily with meals     11/15/17 2321       Princella PellegriniMaczis, Atheena Spano M, PA-C 11/16/17 0037    Lorre NickAllen, Anthony, MD 11/16/17 [phone]

## 2017-11-15 NOTE — ED Triage Notes (Signed)
Pt c/o low back pain and low mid abd pain x 1 week. + nausea, decreased appetite.  Pt states she needs treatment for STD, pt states her boyfriend needs treatment as well but he has not checked in, he is [facility] to her [facility]  Pt denies vaginal discharge, denies itching, denies dysuria.

## 2017-11-15 NOTE — ED Notes (Signed)
ED Provider at bedside. 

## 2017-11-15 NOTE — ED Notes (Signed)
Patient standing at RN station asking for d/c paper work because her cab is here; PA notified and placing patient up for discharge

## 2017-11-16 LAB — HIV ANTIBODY (ROUTINE TESTING W REFLEX): HIV Screen 4th Generation wRfx: NONREACTIVE

## 2017-11-16 LAB — RPR: RPR Ser Ql: NONREACTIVE

## 2017-11-17 LAB — GC/CHLAMYDIA PROBE AMP (~~LOC~~) NOT AT ARMC
CHLAMYDIA, DNA PROBE: NEGATIVE
NEISSERIA GONORRHEA: NEGATIVE

## 2017-12-03 ENCOUNTER — Encounter: Payer: Medicaid Other | Admitting: Obstetrics & Gynecology

## 2017-12-14 IMAGING — DX DG CHEST 2V
2 series · 2 of 2 positions shown · non-contrast
Comparison: 12/16/2015

CLINICAL DATA: Cough and chills

EXAM:
CHEST  2 VIEW

[chest pa]
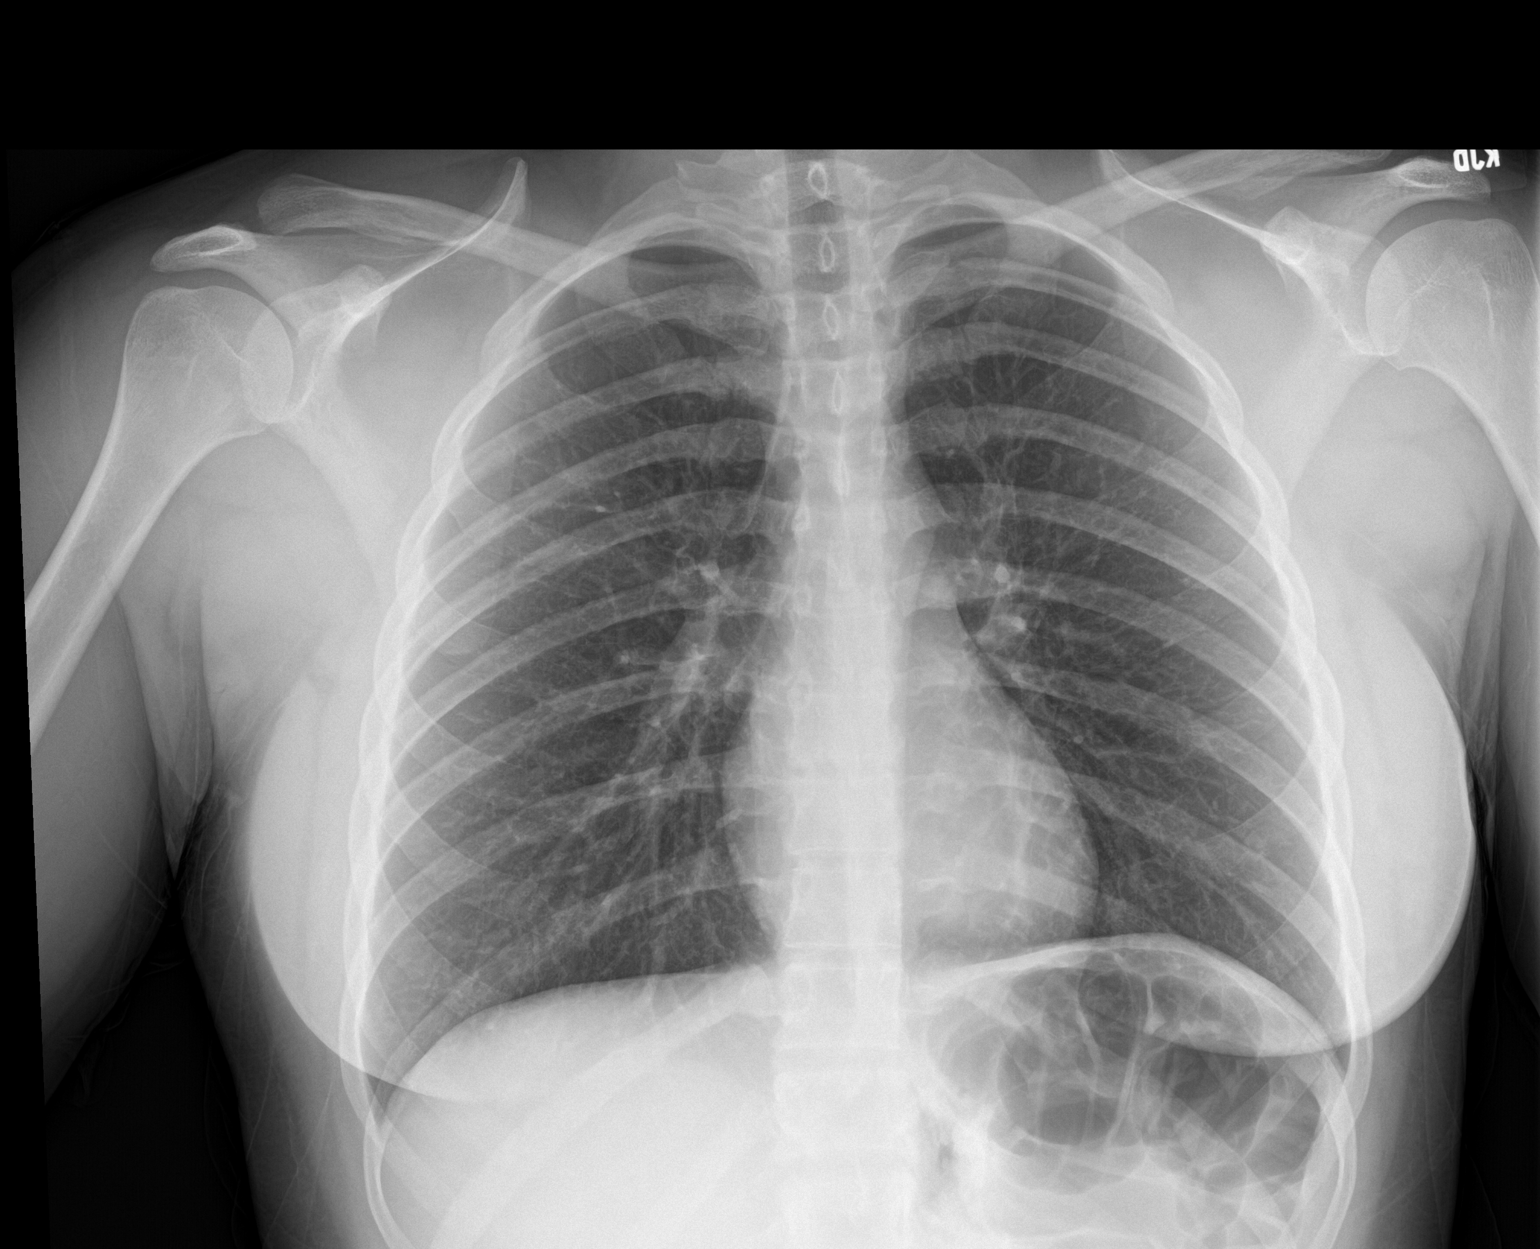

[chest lat]
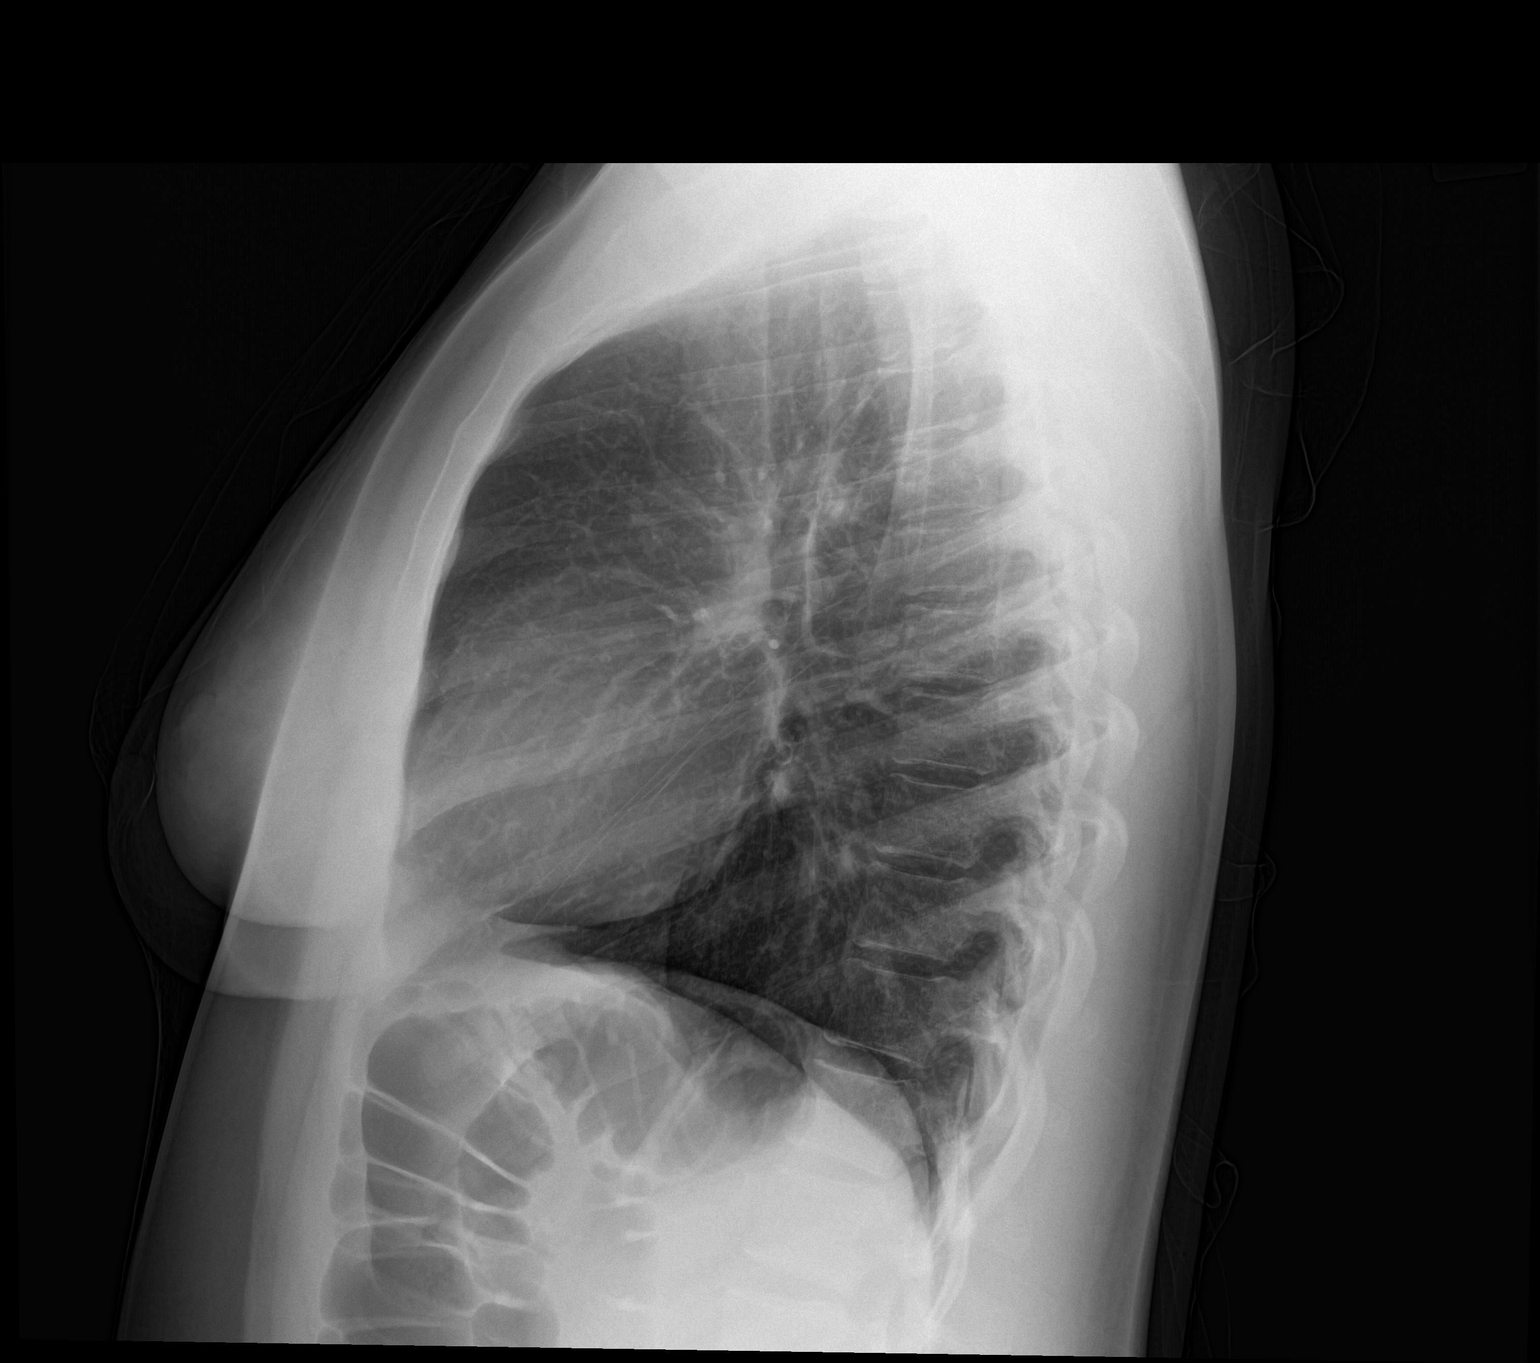

[2 of 2 positions shown; findings below may reference images not displayed]

FINDINGS: The heart size and mediastinal contours are within normal limits.
Both lungs are clear. The visualized skeletal structures are
unremarkable.
IMPRESSION: No active cardiopulmonary disease.

## 2020-06-09 ENCOUNTER — Other Ambulatory Visit: Payer: Self-pay

## 2020-06-09 ENCOUNTER — Ambulatory Visit (HOSPITAL_COMMUNITY)
Admission: EM | Admit: 2020-06-09 | Discharge: 2020-06-09 | Disposition: A | Discharge status: Home / Self Care | Payer: Medicaid Other | Attending: Physician Assistant | Admitting: Physician Assistant

## 2020-06-09 ENCOUNTER — Encounter (HOSPITAL_COMMUNITY): Payer: Self-pay

## 2020-06-09 DIAGNOSIS — Z20822 Contact with and (suspected) exposure to covid-19: Secondary | ICD-10-CM | POA: Insufficient documentation

## 2020-06-09 DIAGNOSIS — Z8249 Family history of ischemic heart disease and other diseases of the circulatory system: Secondary | ICD-10-CM | POA: Insufficient documentation

## 2020-06-09 DIAGNOSIS — G44209 Tension-type headache, unspecified, not intractable: Secondary | ICD-10-CM | POA: Diagnosis not present

## 2020-06-09 DIAGNOSIS — F419 Anxiety disorder, unspecified: Secondary | ICD-10-CM | POA: Diagnosis not present

## 2020-06-09 DIAGNOSIS — R0981 Nasal congestion: Secondary | ICD-10-CM | POA: Diagnosis present

## 2020-06-09 DIAGNOSIS — Z833 Family history of diabetes mellitus: Secondary | ICD-10-CM | POA: Diagnosis not present

## 2020-06-09 DIAGNOSIS — J069 Acute upper respiratory infection, unspecified: Secondary | ICD-10-CM | POA: Diagnosis not present

## 2020-06-09 DIAGNOSIS — M62838 Other muscle spasm: Secondary | ICD-10-CM | POA: Insufficient documentation

## 2020-06-09 DIAGNOSIS — F17210 Nicotine dependence, cigarettes, uncomplicated: Secondary | ICD-10-CM | POA: Insufficient documentation

## 2020-06-09 MED ORDER — CETIRIZINE HCL 10 MG PO TABS: 10.0000 mg | ORAL_TABLET | Freq: Every day | ORAL | 0 refills | Status: DC

## 2020-06-09 MED ORDER — FLUTICASONE PROPIONATE 50 MCG/ACT NA SUSP: 1.0000 | Freq: Every day | NASAL | 1 refills | Status: DC

## 2020-06-09 MED ORDER — TIZANIDINE HCL 4 MG PO TABS: 4.0000 mg | ORAL_TABLET | Freq: Every day | ORAL | 0 refills | Status: AC

## 2020-06-09 MED ORDER — BENZONATATE 100 MG PO CAPS
100.0000 mg | ORAL_CAPSULE | Freq: Three times a day (TID) | ORAL | 0 refills | Status: DC
Start: 2020-06-09 — End: 2022-03-05

## 2020-06-09 NOTE — ED Triage Notes (Addendum)
Pt presents with complaints of nasal congestion, head congestion, sneezing, headache and body aches. Reports her pain is worsened when she sneezes. Denies any fevers. Reports that both of her arms feel swollen. Reports having covid in may.

## 2020-06-09 NOTE — Discharge Instructions (Addendum)
Use flonase daily Take zyrtec daily Take tessalon every 8 hours as needed for cough  Take 2-3 200mg  ibuprofen every 8hours  You may also take tylenol every 6 hours, 2 regular strength, however if you take dayquil do not take this within 6 hours of a tylenol dose  Take zanaflex/muscle relaxer at night, do not drive or drink alcohol within 8 hours of taking this  Establish with family medicine center for PCP  If your Covid-19 test is positive, you will receive a phone call from [facility] regarding your results. Negative test results are not called. Both positive and negative results area always visible on MyChart. If you do not have a MyChart account, sign up instructions are in your discharge papers.   Persons who are directed to care for themselves at home may discontinue isolation under the following conditions:   At least 10 days have passed since symptom onset and  At least 24 hours have passed without running a fever (this means without the use of fever-reducing medications) and  Other symptoms have improved.  Persons infected with COVID-19 who never develop symptoms may discontinue isolation and other precautions 10 days after the date of their first positive COVID-19 test.

## 2020-06-09 NOTE — ED Provider Notes (Signed)
[facility]    CSN: [id number] Arrival date & time: 06/09/20  1221      History   Chief Complaint Chief Complaint  Patient presents with  . Nasal Congestion    HPI Kathleen Hicks is a 29 y.o. female.   Patient reports for nasal congestion, sneezing and headache.  She reports significant upper back pain as well.  Most symptoms started within the last few days.  She reports she has had lots of nasal congestion and frequent sneezing.  She is also developed a cough that is nonproductive.  Sneezing and coughing because her head and upper back to hurt.  She reports her headache in front and does wrap around.  She has also had some ear pain develop over the last day or so.  Denies any fever or chills.  Denies nausea or vomiting.  No diarrhea.  No abdominal pain.  No change in taste or smell.  She has not been running by sick.  She has been taking over-the-counter combination medicines with some mild to moderate relief     Past Medical History:  Diagnosis Date  . Anxiety   . Childhood asthma   . Tobacco abuse     Patient Active Problem List   Diagnosis Date Noted  . Acute pericarditis 12/17/2015  . Tachycardia with 121 - 140 beats per minute 12/17/2015  . Hyperglycemia 12/17/2015  . Hyponatremia 12/17/2015  . Hypochloremia 12/17/2015    Past Surgical History:  Procedure Laterality Date  . NO PAST SURGERIES      OB History   No obstetric history on file.      Home Medications    Prior to Admission medications   Medication Sig Start Date End Date Taking? Authorizing Provider  benzonatate (TESSALON) 100 MG capsule Take 1 capsule (100 mg total) by mouth every 8 (eight) hours. 06/09/20   Mallisa Alameda, Veryl Speak, PA-C  cetirizine (ZYRTEC ALLERGY) 10 MG tablet Take 1 tablet (10 mg total) by mouth daily. 06/09/20   Dejuana Weist, Veryl Speak, PA-C  colchicine 0.6 MG tablet Take 1 tablet (0.6 mg total) by mouth 2 (two) times daily. 12/20/15   Joseph Art, DO  diazepam (VALIUM) 5 MG tablet  Take 1 tablet (5 mg total) by mouth 2 (two) times daily. 04/05/15   Antony Madura, PA-C  diphenhydrAMINE (BENADRYL) 25 mg capsule Take 25 mg by mouth every 6 (six) hours as needed.    [provider]  fluticasone (FLONASE) 50 MCG/ACT nasal spray Place 1 spray into both nostrils daily. 06/09/20   Amro Winebarger, Veryl Speak, PA-C  ibuprofen (ADVIL,MOTRIN) 600 MG tablet Take 1 tablet (600 mg total) by mouth every 6 (six) hours as needed. 03/12/16   Arby Barrette, MD  metroNIDAZOLE (FLAGYL) 500 MG tablet Take 1 tablet (500 mg total) by mouth 2 (two) times daily with a meal. DO NOT CONSUME ALCOHOL WHILE TAKING THIS MEDICATION. 11/15/17   Maczis, Elmer Sow, PA-C  pantoprazole (PROTONIX) 40 MG tablet Take 1 tablet (40 mg total) by mouth 2 (two) times daily. 12/20/15   Joseph Art, DO  tiZANidine (ZANAFLEX) 4 MG tablet Take 1 tablet (4 mg total) by mouth at bedtime for 14 days. 06/09/20 06/23/20  Janei Scheff, Veryl Speak, PA-C    Family History Family History  Problem Relation Age of Onset  . Other Mother        alive and well  . Hypertension Mother   . Hypertension Maternal Grandmother   . Diabetes Maternal Grandmother   .  Kidney failure Maternal Grandmother        died in her 44's.  Marland Kitchen Healthy Father   . Lupus Neg Hx     Social History Social History   Tobacco Use  . Smoking status: Current Every Day Smoker    Packs/day: 0.25    Years: 6.00    Pack years: 1.50    Types: Cigarettes  . Smokeless tobacco: Never Used  Substance Use Topics  . Alcohol use: Yes    Alcohol/week: 0.0 standard drinks    Comment: Occasional drink/social.  . Drug use: No    Types: Marijuana    Comment: denies     Allergies   Patient has no known allergies.   Review of Systems Review of Systems   Physical Exam Triage Vital Signs ED Triage Vitals  Enc Vitals Group     BP 06/09/20 1255 120/81     Pulse Rate 06/09/20 1255 81     Resp 06/09/20 1255 18     Temp 06/09/20 1255 98.5 F (36.9 C)     Temp src --       SpO2 06/09/20 1255 98 %     Weight --      Height --      Head Circumference --      Peak Flow --      Pain Score 06/09/20 1252 10     Pain Loc --      Pain Edu? --      Excl. in GC? --    No data found.  Updated Vital Signs BP 120/81   Pulse 81   Temp 98.5 F (36.9 C)   Resp 18   LMP 05/20/2020   SpO2 98%   Visual Acuity Right Eye Distance:   Left Eye Distance:   Bilateral Distance:    Right Eye Near:   Left Eye Near:    Bilateral Near:     Physical Exam Vitals and nursing note reviewed.  Constitutional:      General: She is not in acute distress.    Appearance: Normal appearance. She is well-developed. She is not ill-appearing.  HENT:     Head: Normocephalic and atraumatic.     Right Ear: Ear canal and external ear normal.     Left Ear: Ear canal and external ear normal.     Ears:     Comments: Bilateral serous fluid behind TMs    Nose: Congestion and rhinorrhea present.     Mouth/Throat:     Mouth: Mucous membranes are moist.     Comments: Postnasal drip in oropharynx.  No erythema or exudates Eyes:     Extraocular Movements: Extraocular movements intact.     Conjunctiva/sclera: Conjunctivae normal.     Pupils: Pupils are equal, round, and reactive to light.  Cardiovascular:     Rate and Rhythm: Normal rate and regular rhythm.     Heart sounds: No murmur heard.   Pulmonary:     Effort: Pulmonary effort is normal. No respiratory distress.     Breath sounds: No wheezing, rhonchi or rales.  Abdominal:     Palpations: Abdomen is soft.     Tenderness: There is no abdominal tenderness.  Musculoskeletal:     Cervical back: Normal range of motion and neck supple. Tenderness (Bilateral trapezius musculature through cervical paraspinal region to the base of the head) present. No rigidity.  Lymphadenopathy:     Cervical: No cervical adenopathy.  Skin:    General: Skin is warm  and dry.     Findings: No rash.  Neurological:     Mental Status: She is alert.       UC Treatments / Results  Labs (all labs ordered are listed, but only abnormal results are displayed) Labs Reviewed  SARS CORONAVIRUS 2 (TAT 6-24 HRS)    EKG   Radiology No results found.  Procedures Procedures (including critical care time)  Medications Ordered in UC Medications - No data to display  Initial Impression / Assessment and Plan / UC Course  I have reviewed the triage vital signs and the nursing notes.  Pertinent labs & imaging results that were available during my care of the patient were reviewed by me and considered in my medical decision making (see chart for details).     #Viral URI #Trapezius muscle spasm #Tension headache Patient is a 29 year old presenting with viral URI with trapezius muscle spasms and tension headaches.  Believe the amelioration of symptoms are causing symptoms to all present more acutely.  We will treat her nasal signs and symptoms with Flonase and Zyrtec.  Tessalon for cough.  Discussed use of ibuprofen for headache and upper back pain.  Discussed additional Tylenol only if she is not using combination over-the-counter medicines, we discussed the risks of using these combination over-the-counter medicines and encouraged her to monitor how much Tylenol she may or may not be taking.  We will also send Zanaflex as I believe she will benefit due to the amount of tension in her trapezius muscles likely causing tension headache.  Covid PCR sent.  Family medicine follow-up recommended to establish care.  Return and follow-up precautions discussed.  Patient verbalized understanding plan Final Clinical Impressions(s) / UC Diagnoses   Final diagnoses:  Viral upper respiratory tract infection  Trapezius muscle spasm  Tension headache     Discharge Instructions     Use flonase daily Take zyrtec daily Take tessalon every 8 hours as needed for cough  Take 2-3 200mg  ibuprofen every 8hours  You may also take tylenol every 6 hours, 2  regular strength, however if you take dayquil do not take this within 6 hours of a tylenol dose  Take zanaflex/muscle relaxer at night, do not drive or drink alcohol within 8 hours of taking this  Establish with family medicine center for PCP  If your Covid-19 test is positive, you will receive a phone call from [facility] regarding your results. Negative test results are not called. Both positive and negative results area always visible on MyChart. If you do not have a MyChart account, sign up instructions are in your discharge papers.   Persons who are directed to care for themselves at home may discontinue isolation under the following conditions:  . At least 10 days have passed since symptom onset and . At least 24 hours have passed without running a fever (this means without the use of fever-reducing medications) and . Other symptoms have improved.  Persons infected with COVID-19 who never develop symptoms may discontinue isolation and other precautions 10 days after the date of their first positive COVID-19 test.       ED Prescriptions    Medication Sig Dispense Auth. Provider   benzonatate (TESSALON) 100 MG capsule Take 1 capsule (100 mg total) by mouth every 8 (eight) hours. 21 capsule Marcellus Pulliam, Marguerita Beards, PA-C   fluticasone (FLONASE) 50 MCG/ACT nasal spray Place 1 spray into both nostrils daily. 11.1 mL Edahi Kroening, Marguerita Beards, PA-C   cetirizine (ZYRTEC ALLERGY) 10 MG tablet  Take 1 tablet (10 mg total) by mouth daily. 30 tablet Omaira Mellen, Veryl Speak, PA-C   tiZANidine (ZANAFLEX) 4 MG tablet Take 1 tablet (4 mg total) by mouth at bedtime for 14 days. 14 tablet Annye Forrey, Veryl Speak, PA-C     PDMP not reviewed this encounter.   Hermelinda Medicus, PA-C 06/10/20 0017

## 2020-06-10 LAB — SARS CORONAVIRUS 2 (TAT 6-24 HRS): SARS Coronavirus 2: NEGATIVE

## 2021-11-19 ENCOUNTER — Ambulatory Visit: Payer: Medicaid Other | Admitting: Podiatry

## 2022-03-05 ENCOUNTER — Ambulatory Visit (HOSPITAL_COMMUNITY)
Admission: EM | Admit: 2022-03-05 | Discharge: 2022-03-05 | Disposition: A | Discharge status: Home / Self Care | Payer: Medicaid Other | Attending: Nurse Practitioner | Admitting: Nurse Practitioner

## 2022-03-05 ENCOUNTER — Ambulatory Visit (INDEPENDENT_AMBULATORY_CARE_PROVIDER_SITE_OTHER): Payer: Medicaid Other

## 2022-03-05 ENCOUNTER — Other Ambulatory Visit: Payer: Self-pay

## 2022-03-05 ENCOUNTER — Encounter (HOSPITAL_COMMUNITY): Payer: Self-pay

## 2022-03-05 DIAGNOSIS — M25571 Pain in right ankle and joints of right foot: Secondary | ICD-10-CM | POA: Diagnosis not present

## 2022-03-05 DIAGNOSIS — S93401A Sprain of unspecified ligament of right ankle, initial encounter: Secondary | ICD-10-CM

## 2022-03-05 MED ORDER — IBUPROFEN 800 MG PO TABS: 800.0000 mg | ORAL_TABLET | Freq: Three times a day (TID) | ORAL | 0 refills | Status: AC | PRN

## 2022-03-05 NOTE — ED Triage Notes (Signed)
Pt states slipped walking off her porch this morning turning her rt ankle. C/o of throbbing to ankle and heal of foot.  ?

## 2022-03-05 NOTE — Discharge Instructions (Addendum)
Your x-rays of the foot and ankle are negative. ?RICE therapy, rest, ice, compression, and elevation.  Apply ice to the right ankle and foot at least 3-4 times daily, apply ice for 20 minutes, remove for 1 hour, then repeat. ?Perform gentle range of motion exercises to prevent immobility of the joint. ?Try to bear weight on the right foot and ankle as tolerated.  This will help decrease pain and swelling. ?It may take 2 to 4 weeks for your symptoms to improve.  Follow-up if symptoms worsen during that time. ?

## 2022-03-05 NOTE — ED Provider Notes (Signed)
?[facility] ? ? ? ?CSN: [id number] ?Arrival date & time: 03/05/22  0805 ? ? ?  ? ?History   ?Chief Complaint ?Chief Complaint  ?Patient presents with  ? Ankle Pain  ? ? ?HPI ?Kathleen Hicks is a 31 y.o. female.  ? ?The patient is a 31 year old female who presents with right foot and right ankle pain.  Patient states that she was coming down the steps out of her house this morning, she rolled the right foot/ankle outward.  She suddenly developed pain, tenderness, and pain with weightbearing.  She has pain to the lateral aspect of the right ankle and states she has pain in her heel.  She also states that she felt a "pop" when the incident occurred.  She denies any previous trauma or injury to the right foot.  She further denies radiation of pain, numbness, tingling, or inability to bear weight.  She did not take any medication or perform any intervention after the injury. ? ? ?Ankle Pain ?Injury: yes   ?Pain details:  ?  Quality:  Aching ?  Radiates to:  Does not radiate ?  Severity:  Moderate ?  Timing:  Constant ?  Progression:  Unchanged ?Worsened by:  Bearing weight, flexion and rotation ?Ineffective treatments:  None tried ?Associated symptoms: swelling   ?Associated symptoms: no numbness and no tingling   ? ?Past Medical History:  ?Diagnosis Date  ? Anxiety   ? Childhood asthma   ? Tobacco abuse   ? ? ?Patient Active Problem List  ? Diagnosis Date Noted  ? Acute pericarditis 12/17/2015  ? Tachycardia with 121 - 140 beats per minute 12/17/2015  ? Hyperglycemia 12/17/2015  ? Hyponatremia 12/17/2015  ? Hypochloremia 12/17/2015  ? ? ?Past Surgical History:  ?Procedure Laterality Date  ? NO PAST SURGERIES    ? ? ?OB History   ?No obstetric history on file. ?  ? ? ? ?Home Medications   ? ?Prior to Admission medications   ?Medication Sig Start Date End Date Taking? Authorizing Provider  ?ibuprofen (ADVIL) 800 MG tablet Take 1 tablet (800 mg total) by mouth every 8 (eight) hours as needed for up to 10 days for  moderate pain. 03/05/22 03/15/22 Yes Joclyn Alsobrook-Warren, Sadie Haber, NP  ?diphenhydrAMINE (BENADRYL) 25 mg capsule Take 25 mg by mouth every 6 (six) hours as needed.    [provider]  ? ? ?Family History ?Family History  ?Problem Relation Age of Onset  ? Other Mother   ?     alive and well  ? Hypertension Mother   ? Hypertension Maternal Grandmother   ? Diabetes Maternal Grandmother   ? Kidney failure Maternal Grandmother   ?     died in her 63's.  ? Healthy Father   ? Lupus Neg Hx   ? ? ?Social History ?Social History  ? ?Tobacco Use  ? Smoking status: Every Day  ?  Packs/day: 0.25  ?  Years: 6.00  ?  Pack years: 1.50  ?  Types: Cigarettes  ? Smokeless tobacco: Never  ?Substance Use Topics  ? Alcohol use: Yes  ?  Alcohol/week: 0.0 standard drinks  ?  Comment: Occasional drink/social.  ? Drug use: No  ?  Types: Marijuana  ?  Comment: denies  ? ? ? ?Allergies   ?Patient has no known allergies. ? ? ?Review of Systems ?Review of Systems  ?Constitutional: Negative.   ?Musculoskeletal:  Positive for joint swelling (right ankle).  ?     Pain  in heel of right foot and right ankle  ?Skin: Negative.   ?Psychiatric/Behavioral: Negative.    ? ? ?Physical Exam ?Triage Vital Signs ?ED Triage Vitals [03/05/22 0823]  ?Enc Vitals Group  ?   BP 122/71  ?   Pulse Rate 89  ?   Resp 18  ?   Temp 98.3 ?F (36.8 ?C)  ?   Temp Source Oral  ?   SpO2 100 %  ?   Weight   ?   Height   ?   Head Circumference   ?   Peak Flow   ?   Pain Score 7  ?   Pain Loc   ?   Pain Edu?   ?   Excl. in GC?   ? ?No data found. ? ?Updated Vital Signs ?BP 122/71 (BP Location: Left Arm)   Pulse 89   Temp 98.3 ?F (36.8 ?C) (Oral)   Resp 18   LMP 02/05/2022   SpO2 100%  ? ?Visual Acuity ?Right Eye Distance:   ?Left Eye Distance:   ?Bilateral Distance:   ? ?Right Eye Near:   ?Left Eye Near:    ?Bilateral Near:    ? ?Physical Exam ?Vitals reviewed.  ?Constitutional:   ?   General: She is not in acute distress. ?   Appearance: Normal appearance.  ?HENT:  ?    Head: Normocephalic and atraumatic.  ?Cardiovascular:  ?   Rate and Rhythm: Normal rate and regular rhythm.  ?Pulmonary:  ?   Effort: Pulmonary effort is normal.  ?   Breath sounds: Normal breath sounds.  ?Musculoskeletal:  ?   Right lower leg: Normal.  ?   Right ankle: Swelling present. No deformity or ecchymosis. Tenderness present over the lateral malleolus and medial malleolus. Decreased range of motion. Normal pulse.  ?   Right foot: Normal capillary refill. Tenderness (calcaneus of right foot) present. Normal pulse.  ?   Comments: Swelling noted to the lateral aspect of the right ankle.  Tenderness to the heel of the right foot.  Positive valgus and varus stress.  ?Neurological:  ?   Mental Status: She is alert.  ? ? ? ?UC Treatments / Results  ?Labs ?(all labs ordered are listed, but only abnormal results are displayed) ?Labs Reviewed - No data to display ? ?EKG ? ? ?Radiology ?DG Ankle Complete Right ? ?Result Date: 03/05/2022 ?CLINICAL DATA:  Right ankle injury this morning on steps EXAM: RIGHT ANKLE - COMPLETE 3+ VIEW COMPARISON:  None. FINDINGS: Mild lateral right ankle soft tissue swelling. No fracture or subluxation. No focal osseous lesions. No significant arthropathy. No radiopaque foreign bodies. IMPRESSION: Mild lateral right ankle soft tissue swelling, with no fracture or subluxation. Electronically Signed   By: Delbert Phenix M.D.   On: 03/05/2022 08:55  ? ?DG Foot Complete Right ? ?Result Date: 03/05/2022 ?CLINICAL DATA:  Right foot and ankle injury this morning on stops EXAM: RIGHT FOOT COMPLETE - 3+ VIEW COMPARISON:  None. FINDINGS: There is no evidence of fracture or dislocation. Lisfranc joint appears intact. There is no evidence of arthropathy or other focal bone abnormality. Soft tissues are unremarkable. IMPRESSION: No right foot fracture or malalignment. Electronically Signed   By: Delbert Phenix M.D.   On: 03/05/2022 08:57   ? ?Procedures ?Procedures (including critical care  time) ? ?Medications Ordered in UC ?Medications - No data to display ? ?Initial Impression / Assessment and Plan / UC Course  ?I have reviewed the triage vital  signs and the nursing notes. ? ?Pertinent labs & imaging results that were available during my care of the patient were reviewed by me and considered in my medical decision making (see chart for details). ? ?Patient presents with right foot and right ankle pain after rolling the right ankle when she was leaving home today.  She immediately developed pain and swelling to the heel of the right foot and the right ankle.  She does have the ability to weight-bear, swelling is controlled at this time, and she has full passive range of motion of the right foot and ankle.  Symptoms are consistent with a an ankle sprain based on the mechanism of injury.  Will provide the patient with a lace up ankle brace and postop shoe for support.  Discussed with patient the importance of RICE therapy during this time.  Patient instructed to apply ice, on for 20 minutes, off for 1 hour, and then repeat.  Also prescribed ibuprofen for pain and inflammation.  Recommended gentle range of motion exercises and weightbearing as tolerated to help decrease pain, swelling, and improve mobility.  Patient also informed that it may take 2 to 4 weeks for her symptoms to improve or resolve.  Follow-up as needed. ?Final Clinical Impressions(s) / UC Diagnoses  ? ?Final diagnoses:  ?Sprain of right ankle, unspecified ligament, initial encounter  ? ? ? ?Discharge Instructions   ? ?  ?Your x-rays of the foot and ankle are negative. ?RICE therapy, rest, ice, compression, and elevation.  Apply ice to the right ankle and foot at least 3-4 times daily, apply ice for 20 minutes, remove for 1 hour, then repeat. ?Perform gentle range of motion exercises to prevent immobility of the joint. ?Try to bear weight on the right foot and ankle as tolerated.  This will help decrease pain and swelling. ?It may take 2  to 4 weeks for your symptoms to improve.  Follow-up if symptoms worsen during that time. ? ? ? ? ?ED Prescriptions   ? ? Medication Sig Dispense Auth. Provider  ? ibuprofen (ADVIL) 800 MG tablet Take 1 tablet (800 mg total) by

## 2023-02-12 ENCOUNTER — Other Ambulatory Visit: Payer: Self-pay

## 2023-02-12 ENCOUNTER — Emergency Department (HOSPITAL_BASED_OUTPATIENT_CLINIC_OR_DEPARTMENT_OTHER)
Admission: EM | Admit: 2023-02-12 | Discharge: 2023-02-12 | Disposition: A | Discharge status: Home / Self Care | Payer: Medicaid Other | Attending: Emergency Medicine | Admitting: Emergency Medicine

## 2023-02-12 ENCOUNTER — Encounter (HOSPITAL_BASED_OUTPATIENT_CLINIC_OR_DEPARTMENT_OTHER): Payer: Self-pay

## 2023-02-12 DIAGNOSIS — N930 Postcoital and contact bleeding: Secondary | ICD-10-CM | POA: Diagnosis not present

## 2023-02-12 DIAGNOSIS — B9689 Other specified bacterial agents as the cause of diseases classified elsewhere: Secondary | ICD-10-CM

## 2023-02-12 DIAGNOSIS — Z113 Encounter for screening for infections with a predominantly sexual mode of transmission: Secondary | ICD-10-CM | POA: Diagnosis not present

## 2023-02-12 DIAGNOSIS — N760 Acute vaginitis: Secondary | ICD-10-CM | POA: Insufficient documentation

## 2023-02-12 DIAGNOSIS — N938 Other specified abnormal uterine and vaginal bleeding: Secondary | ICD-10-CM | POA: Diagnosis present

## 2023-02-12 LAB — URINALYSIS, ROUTINE W REFLEX MICROSCOPIC
Bilirubin Urine: NEGATIVE
Glucose, UA: NEGATIVE mg/dL
Hgb urine dipstick: NEGATIVE
Ketones, ur: NEGATIVE mg/dL
Leukocytes,Ua: NEGATIVE
Nitrite: NEGATIVE
Protein, ur: NEGATIVE mg/dL
Specific Gravity, Urine: 1.016 (ref 1.005–1.030)
pH: 6 (ref 5.0–8.0)

## 2023-02-12 LAB — WET PREP, GENITAL
Sperm: NONE SEEN
Trich, Wet Prep: NONE SEEN
WBC, Wet Prep HPF POC: >=10 — AB (ref <10)
Yeast Wet Prep HPF POC: NONE SEEN

## 2023-02-12 LAB — PREGNANCY, URINE: Preg Test, Ur: NEGATIVE

## 2023-02-12 MED ORDER — METRONIDAZOLE 500 MG PO TABS: 500.0000 mg | ORAL_TABLET | Freq: Two times a day (BID) | ORAL | 0 refills | Status: DC

## 2023-02-12 NOTE — ED Triage Notes (Signed)
Patient here POV from Home.  Endorses vaginal Bleeding 2 weeks ago. Would like to be tested for STI. No Vaginal Discharge.   LMP: 02/03/23.   NAD Noted during Triage. A&Ox4. GCS 15. Ambulatory.

## 2023-02-12 NOTE — Discharge Instructions (Addendum)
You were seen in the ER today for evaluation of your vaginal bleeding after intercourse. Please follow up with your gynecologist for further testing on this. The results of your gonorrhea or chlamydia will be back in the next few days. Please make sure you follow up on your MyChart about this. You also have bacterial vaginosis. You will need to take an antibiotic twice a day for the next week. Please do not drink alcohol while on this medication as you can get very ill. If you have any concerns, new or worsening symptoms, please return to the nearest ER for re-evaluation.   Contact a doctor if: The bleeding lasts more than one week. You feel dizzy at times. You feel like you may vomit (nausea). You vomit. You feel light-headed or weak. Your symptoms get worse. Get help right away if: You faint. You have to change pads every hour. You have pain in your belly. You have a fever or chills. You get sweaty or weak. You pass large blood clots from your vagina. These symptoms may be an emergency. Get help right away. Call your local emergency services (911 in the [place] Do not wait to see if the symptoms will go away. Do not drive yourself to the hospital.

## 2023-02-12 NOTE — ED Notes (Signed)
This RN chaperoned pelvic exam.

## 2023-02-12 NOTE — ED Notes (Signed)
States she would not like to have Lab Specimens collected. Also inquires if she can Self-Swab for STI Testing. Informed Patient EDP will order appropriate testing and can allow for self-testing if appropriate.

## 2023-02-12 NOTE — ED Provider Notes (Signed)
[facility] Provider Note   CSN: [id number] Arrival date & time: 02/12/23  1756     History Chief Complaint  Patient presents with   Vaginal Bleeding    Kathleen Hicks is a 32 y.o. female with h/o chronic back pain and anxiety presents to the ER for evaluation of vaginal bleeding after sexual intercourse. The patient currently has a female partner and report that they use toys during intercourse. She thinks she may have some occasional dryness, but reports the intercourse is rough. She only sees blood whenever she wipes the toilet paper, but there is not any in her underwear or sheets. She denies any abnormal vaginal discharge, dysuria, hematuria, urgency, frequency, or abdominal pain.    Vaginal Bleeding Associated symptoms: no abdominal pain, no dysuria and no nausea        Home Medications Prior to Admission medications   Medication Sig Start Date End Date Taking? Authorizing Provider  diphenhydrAMINE (BENADRYL) 25 mg capsule Take 25 mg by mouth every 6 (six) hours as needed.    [provider]      Allergies    Patient has no known allergies.    Review of Systems   Review of Systems  Gastrointestinal:  Negative for abdominal pain, nausea and vomiting.  Genitourinary:  Positive for vaginal bleeding. Negative for dysuria, frequency, hematuria, pelvic pain and urgency.    Physical Exam Updated Vital Signs BP 125/81   Pulse 72   Temp 98.5 F (36.9 C) (Oral)   Resp 16   Ht '5\' 1"'$  (1.549 m)   Wt 73 kg   SpO2 100%   BMI 30.41 kg/m  Physical Exam Vitals and nursing note reviewed. Exam conducted with a chaperone present Apolonio Schneiders, Therapist, sports).  Constitutional:      General: She is not in acute distress.    Appearance: Normal appearance. She is not ill-appearing or toxic-appearing.  Eyes:     General: No scleral icterus. Pulmonary:     Effort: Pulmonary effort is normal. No respiratory distress.  Abdominal:     Palpations:  Abdomen is soft.     Tenderness: There is no abdominal tenderness. There is no guarding or rebound.  Genitourinary:    Cervix: No cervical motion tenderness or friability.     Uterus: Normal.      Adnexa:        Right: No tenderness.         Left: No tenderness.       Comments: Yellow/white thin discharge seen. No CMT. No cervical friability. Cervix does appear mildly enlarged, although may be at her baseline since she has birthed vaginally. No bleeding noted.  Skin:    General: Skin is dry.     Findings: No rash.  Neurological:     General: No focal deficit present.     Mental Status: She is alert. Mental status is at baseline.  Psychiatric:        Mood and Affect: Mood normal.     ED Results / Procedures / Treatments   Labs (all labs ordered are listed, but only abnormal results are displayed) Labs Reviewed  WET PREP, GENITAL  URINALYSIS, ROUTINE W REFLEX MICROSCOPIC  PREGNANCY, URINE  GC/CHLAMYDIA PROBE AMP ([place]) NOT AT [facility]    EKG None  Radiology No results found.  Procedures Procedures   Medications Ordered in ED Medications - No data to display  ED Course/ Medical Decision Making/ A&P  Medical Decision Making Amount and/or Complexity of Data Reviewed Labs: ordered.  Risk Prescription drug management.   32 y/o F presents to the ER for evaluation of postcoital bleeding and STD testing.  Differential diagnosis includes but is not limited to PID, STD, vaginal dryness, endometriosis, skin tears.  Vital signs unremarkable.  Physical exam as noted above.  Patient does not have any CMT tenderness upon movement of the speculum.  She does have a slightly enlarged cervix but could be due to her giving birth vaginally prior.  Doubt any PID at this time.  Patient has not had some small amount of malodorous discharge seen, wet prep obtained.  I do not see any trauma to the vaginal walls or cervix.  There is no active bleeding.  No  cervical friability.  Unsure what is causing her bleeding after intercourse however she can follow-up outpatient for this.  She already has a gynecologist [facility]  I independently reviewed and interpreted the patient's labs.  Pregnancy test is negative.  Urinalysis unremarkable.  Wet prep does show clue cells with some white blood cells.  Will treat for bacterial vaginosis with Flagyl.  Encouraged patient to follow-up with her MyChart for her gonorrhea and chlamydia results within the next few days.  If positive, someone will call her in a prescription.  Again, recommended that she follow-up with a gynecologist at [facility].  We discussed return precautions red flag symptoms.  Patient verbalized understanding and agrees the plan.  Patient is stable being discharged home in good condition.   Final Clinical Impression(s) / ED Diagnoses Final diagnoses:  Postcoital bleeding  Screening examination for STD (sexually transmitted disease)  Bacterial vaginosis    Rx / DC Orders ED Discharge Orders          Ordered    metroNIDAZOLE (FLAGYL) 500 MG tablet  2 times daily        02/12/23 2236              Sherrell Puller, [place] 02/14/23 [id number]    Charlesetta Shanks, MD 02/14/23 1555

## 2023-02-14 LAB — GC/CHLAMYDIA PROBE AMP (~~LOC~~) NOT AT ARMC
Chlamydia: NEGATIVE
Comment: NEGATIVE
Comment: NORMAL
Neisseria Gonorrhea: NEGATIVE

## 2024-01-18 ENCOUNTER — Ambulatory Visit (HOSPITAL_COMMUNITY)
Admission: EM | Admit: 2024-01-18 | Discharge: 2024-01-18 | Disposition: A | Discharge status: Home / Self Care | Payer: MEDICAID

## 2024-01-18 ENCOUNTER — Encounter (HOSPITAL_COMMUNITY): Payer: Self-pay | Admitting: Emergency Medicine

## 2024-01-18 ENCOUNTER — Emergency Department (HOSPITAL_COMMUNITY)
Admission: EM | Admit: 2024-01-18 | Discharge: 2024-01-18 | Discharge status: Against Medical Advice | Payer: MEDICAID | Attending: Emergency Medicine | Admitting: Emergency Medicine

## 2024-01-18 ENCOUNTER — Encounter (HOSPITAL_COMMUNITY): Payer: Self-pay

## 2024-01-18 ENCOUNTER — Other Ambulatory Visit: Payer: Self-pay

## 2024-01-18 DIAGNOSIS — S6990XA Unspecified injury of unspecified wrist, hand and finger(s), initial encounter: Secondary | ICD-10-CM | POA: Insufficient documentation

## 2024-01-18 DIAGNOSIS — Z5321 Procedure and treatment not carried out due to patient leaving prior to being seen by health care provider: Secondary | ICD-10-CM | POA: Diagnosis not present

## 2024-01-18 DIAGNOSIS — W25XXXA Contact with sharp glass, initial encounter: Secondary | ICD-10-CM | POA: Insufficient documentation

## 2024-01-18 NOTE — ED Notes (Signed)
Patient left when security tried to get her attention and stated "I dont like yall anyways".

## 2024-01-18 NOTE — ED Triage Notes (Signed)
Punched a mirror approximately one hour ago-11:36 am today.patient has a laceration across right ring finger.  Bleeding is controlled

## 2024-01-18 NOTE — ED Triage Notes (Signed)
Attempted to triage patient but too occupied with phone and would only answer the question of what happen to her fingers in which she replied "I punched a mirror".  When trying to ask about wound patient went back to her phone and not answering questions.

## 2024-01-18 NOTE — ED Provider Triage Note (Addendum)
Emergency Medicine Provider Triage Evaluation Note  Kathleen Hicks , a 33 y.o. female  was evaluated in triage.  Pt complains of having punched a mirror today out of anger. States that she does have numbness and tingling over 4-5th fingers on R hand. Pt refused to answer questions being too occupied on her phone. Said that she was too busy reading her comments. Asked multiple times to put the phone away with only minimal response. She would put phone down but as soon provider looks at computer, she is back on her phone.   Review of Systems  Positive: See above  Negative: See above  Physical Exam  Ht 5\' 1"  (1.549 m)   Wt 72.6 kg   LMP 01/11/2024   BMI 30.23 kg/m  Gen:   Awake, no distress   Resp:  Normal effort  MSK:   Moves extremities without difficulty  Other:    Medical Decision Making  Medically screening exam initiated at 1:56 PM.  Appropriate orders placed.  Kathleen Hicks was informed that the remainder of the evaluation will be completed by another provider, this initial triage assessment does not replace that evaluation, and the importance of remaining in the ED until their evaluation is complete.     Lunette Stands, PA-C 01/18/24 1400    Baldo Ash [place], [place] 01/18/24 [phone]

## 2024-01-18 NOTE — ED Notes (Signed)
Pt would not get off the phone long enough to obtain vitals. Pt on facebook and wont put down phone. PA, Nurse and this emt attempted to talk to pt

## 2024-01-18 NOTE — ED Notes (Signed)
Unable to obtain vital signs due to patient ignoring staff and playing on her phone and not answering questions.  Also refusing to answer questions.

## 2024-01-19 ENCOUNTER — Encounter (HOSPITAL_COMMUNITY): Payer: Self-pay

## 2024-01-19 ENCOUNTER — Other Ambulatory Visit (HOSPITAL_COMMUNITY): Payer: Self-pay

## 2024-01-19 ENCOUNTER — Ambulatory Visit (HOSPITAL_COMMUNITY)
Admission: EM | Admit: 2024-01-19 | Discharge: 2024-01-19 | Disposition: A | Discharge status: Home / Self Care | Payer: MEDICAID | Attending: Family Medicine | Admitting: Family Medicine

## 2024-01-19 DIAGNOSIS — Z23 Encounter for immunization: Secondary | ICD-10-CM | POA: Diagnosis not present

## 2024-01-19 DIAGNOSIS — S61214A Laceration without foreign body of right ring finger without damage to nail, initial encounter: Secondary | ICD-10-CM | POA: Diagnosis not present

## 2024-01-19 MED ORDER — TETANUS-DIPHTH-ACELL PERTUSSIS 5-2.5-18.5 LF-MCG/0.5 IM SUSY
PREFILLED_SYRINGE | INTRAMUSCULAR | Status: AC
  Filled 2024-01-19: qty 0.5

## 2024-01-19 MED ORDER — IBUPROFEN 800 MG PO TABS
800.0000 mg | ORAL_TABLET | Freq: Three times a day (TID) | ORAL | 0 refills | Status: DC
  Filled 2024-01-19: qty 21, 7d supply, fill #0

## 2024-01-19 MED ORDER — LIDOCAINE HCL (PF) 1 % IJ SOLN
INTRAMUSCULAR | Status: AC
  Filled 2024-01-19: qty 30

## 2024-01-19 MED ORDER — TETANUS-DIPHTH-ACELL PERTUSSIS 5-2.5-18.5 LF-MCG/0.5 IM SUSY
0.5000 mL | PREFILLED_SYRINGE | Freq: Once | INTRAMUSCULAR | Status: AC
  Administered 2024-01-19: 0.5 mL via INTRAMUSCULAR

## 2024-01-19 NOTE — ED Triage Notes (Signed)
She cut her right right finger on a mirror yesterday around 1300. She does have feeling below the laceration.

## 2024-01-19 NOTE — ED Provider Notes (Signed)
[facility]   [id number] 01/19/24 Arrival Time: 0845  ASSESSMENT & PLAN:  1. Laceration of right ring finger without foreign body without damage to nail, initial encounter    Procedure: Laceration Repair Verbal consent obtained. Patient provided with risks and alternatives to the procedure. Wound copiously irrigated with NS then cleansed with betadine. Local anesthesia: Lidocaine 1% without epinephrine. Wound carefully explored. No foreign body, tendon injury, or nonviable tissue were noted. Using sterile technique, 4 interrupted 5-0 Prolene sutures were placed to reapproximate the wound. Procedure tolerated well. No complications. Minimal bleeding. Advised to look for and return for any signs of infection such as redness, swelling, discharge, or worsening pain. Return for suture removal in 7 days.  Work note provided. See AVS for wound care instructions.  Meds ordered this encounter  Medications   ibuprofen (ADVIL) 800 MG tablet    Sig: Take 1 tablet (800 mg total) by mouth 3 (three) times daily with meals.    Dispense:  21 tablet    Refill:  0   Tdap (BOOSTRIX) injection 0.5 mL    Reviewed expectations re: course of current medical issues. Questions answered. Outlined signs and symptoms indicating need for more acute intervention. Patient verbalized understanding. After Visit Summary given.   SUBJECTIVE:  Kathleen Hicks is a 33 y.o. female who presents with a laceration of R 4th finger. Aprox 18 hours ago. Has flushed under running water. Went to ED yest evening. LWBS. Feels Td is not UTD. No extremity sensation changes or weakness of RUE. Denies current hand/finger pain.   OBJECTIVE:  Vitals:   01/19/24 0932  BP: 122/86  Pulse: (!) 102  Resp: 18  Temp: 98.7 F (37.1 C)  TempSrc: Oral  SpO2: 97%     General appearance: alert; no distress Skin: curved and slightly jagged laceration of R distal dorsal 4th finger; size: approx 1.5 cm; no foreign bodies, ragged  edges; without active bleeding; R 4th finger with FROM, normal cap refill, and normal distal sensation Psychological: alert and cooperative; normal mood and affect    Labs Reviewed - No data to display  No results found.  No Known Allergies  Past Medical History:  Diagnosis Date   Anxiety    Childhood asthma    Tobacco abuse    Social History   Socioeconomic History   Marital status: Significant Other    Spouse name: Not on file   Number of children: Not on file   Years of education: Not on file   Highest education level: Not on file  Occupational History   Not on file  Tobacco Use   Smoking status: Every Day    Current packs/day: 0.25    Average packs/day: 0.3 packs/day for 6.0 years (1.5 ttl pk-yrs)    Types: Cigarettes   Smokeless tobacco: Never  Vaping Use   Vaping status: Never Used  Substance and Sexual Activity   Alcohol use: Yes    Alcohol/week: 0.0 standard drinks of alcohol    Comment: Occasional drink/social.   Drug use: No    Types: Marijuana    Comment: denies   Sexual activity: Not on file  Other Topics Concern   Not on file  Social History Narrative   Lives in [place] with her 27 y/o son, mother, and step-father.  She does not routinely exercise.   Social Drivers of Corporate investment banker Strain: Not on file  Food Insecurity: Not on file  Transportation Needs: Not on file  Physical Activity:  Not on file  Stress: Not on file  Social Connections: Not on file          Mardella Layman, MD 01/19/24 1027

## 2024-01-28 ENCOUNTER — Encounter (HOSPITAL_COMMUNITY): Payer: Self-pay

## 2024-01-28 ENCOUNTER — Ambulatory Visit (HOSPITAL_COMMUNITY)
Admission: RE | Admit: 2024-01-28 | Discharge: 2024-01-28 | Disposition: A | Discharge status: Home / Self Care | Payer: MEDICAID | Source: Ambulatory Visit

## 2024-01-28 NOTE — ED Triage Notes (Signed)
 Patient here for suture removal of sutures placed on 01/19/2024 on right ring finger.  No redness or swelling around area.

## 2024-01-29 ENCOUNTER — Other Ambulatory Visit (HOSPITAL_COMMUNITY): Payer: Self-pay

## 2024-03-29 ENCOUNTER — Encounter (HOSPITAL_COMMUNITY): Payer: Self-pay

## 2024-03-29 ENCOUNTER — Ambulatory Visit (HOSPITAL_COMMUNITY)
Admission: EM | Admit: 2024-03-29 | Discharge: 2024-03-29 | Disposition: A | Discharge status: Home / Self Care | Payer: MEDICAID | Attending: Emergency Medicine | Admitting: Emergency Medicine

## 2024-03-29 ENCOUNTER — Ambulatory Visit (INDEPENDENT_AMBULATORY_CARE_PROVIDER_SITE_OTHER): Payer: MEDICAID

## 2024-03-29 DIAGNOSIS — S62326A Displaced fracture of shaft of fifth metacarpal bone, right hand, initial encounter for closed fracture: Secondary | ICD-10-CM

## 2024-03-29 MED ORDER — NAPROXEN 500 MG PO TABS: 500.0000 mg | ORAL_TABLET | Freq: Two times a day (BID) | ORAL | 0 refills | Status: DC

## 2024-03-29 NOTE — ED Triage Notes (Signed)
 Patient here today with c/o right hand pain X 2 days after running out the back door and hitting her hand on the door. Patient has been having increased pain and swelling since. There is some bruising on the palm of her hand. Patient did ice the area the first day with some relief.

## 2024-03-29 NOTE — Discharge Instructions (Addendum)
 We applied a splint in clinic today due to the fracture in your hand as discussed.  Call [facility] to schedule a follow-up appointment for further management of this injury.   You can take Naproxen twice daily as needed for pain. DO not take this with other NSAIDs including Ibuprofen, Motrin, Aleve, and Advil as this could cause gastrointestinal bleeding. You can take Tylenol as needed for breakthrough pain every 4-6 hours as needed. Do not exceed 4000 mg of Tylenol in a day.   Return here as needed.

## 2024-03-29 NOTE — ED Provider Notes (Signed)
 [facility]    CSN: [id number] Arrival date & time: 03/29/24  0825      History   Chief Complaint Chief Complaint  Patient presents with   Hand Injury    HPI Kathleen Hicks is a 33 y.o. female.   Patient presents with right hand pain and swelling x 2 days. Patient states that she was running out her back door and hit her hand on the door and has had increased pain and swelling since then. Patient also reports bruising to the palm of her hand.   She reports applying ice to her hand after the injury with some relief.   Denies any other injuries.    Hand Injury   Past Medical History:  Diagnosis Date   Anxiety    Childhood asthma    Tobacco abuse     Patient Active Problem List   Diagnosis Date Noted   Acute pericarditis 12/17/2015   Tachycardia with heart rate 121-140 beats per minute 12/17/2015   Hyperglycemia 12/17/2015   Hyponatremia 12/17/2015   Hypochloremia 12/17/2015    Past Surgical History:  Procedure Laterality Date   NO PAST SURGERIES      OB History   No obstetric history on file.      Home Medications    Prior to Admission medications   Medication Sig Start Date End Date Taking? Authorizing Provider  naproxen (NAPROSYN) 500 MG tablet Take 1 tablet (500 mg total) by mouth 2 (two) times daily. 03/29/24  Yes Susann Givens, Lindsay Soulliere A, NP  ALPRAZolam Prudy Feeler) 1 MG tablet Take 1 mg by mouth at bedtime as needed for anxiety.    [provider]    Family History Family History  Problem Relation Age of Onset   Other Mother        alive and well   Hypertension Mother    Hypertension Maternal Grandmother    Diabetes Maternal Grandmother    Kidney failure Maternal Grandmother        died in her 42's.   Healthy Father    Lupus Neg Hx     Social History Social History   Tobacco Use   Smoking status: Former    Current packs/day: 0.25    Average packs/day: 0.3 packs/day for 6.0 years (1.5 ttl pk-yrs)    Types: Cigarettes    Smokeless tobacco: Never  Vaping Use   Vaping status: Every Day  Substance Use Topics   Alcohol use: Yes    Alcohol/week: 0.0 standard drinks of alcohol    Comment: Occasional drink/social.   Drug use: Not Currently    Types: Marijuana    Comment: denies     Allergies   Patient has no known allergies.   Review of Systems Review of Systems  Per HPI  Physical Exam Triage Vital Signs ED Triage Vitals  Encounter Vitals Group     BP 03/29/24 0853 113/82     Systolic BP Percentile --      Diastolic BP Percentile --      Pulse Rate 03/29/24 0853 74     Resp 03/29/24 0853 16     Temp 03/29/24 0853 98.5 F (36.9 C)     Temp Source 03/29/24 0853 Oral     SpO2 03/29/24 0853 98 %     Weight --      Height --      Head Circumference --      Peak Flow --      Pain Score 03/29/24 0855 7  Pain Loc --      Pain Education --      Exclude from Growth Chart --    No data found.  Updated Vital Signs BP 113/82 (BP Location: Left Arm)   Pulse 74   Temp 98.5 F (36.9 C) (Oral)   Resp 16   LMP 03/12/2024 (Approximate)   SpO2 98%   Visual Acuity Right Eye Distance:   Left Eye Distance:   Bilateral Distance:    Right Eye Near:   Left Eye Near:    Bilateral Near:     Physical Exam Vitals and nursing note reviewed.  Constitutional:      General: She is awake. She is not in acute distress.    Appearance: Normal appearance. She is well-developed and well-groomed. She is not ill-appearing.  Musculoskeletal:     Right hand: Swelling, tenderness and bony tenderness present.  Neurological:     Mental Status: She is alert.  Psychiatric:        Behavior: Behavior is cooperative.      UC Treatments / Results  Labs (all labs ordered are listed, but only abnormal results are displayed) Labs Reviewed - No data to display  EKG   Radiology DG Hand Complete Right Result Date: 03/29/2024 CLINICAL DATA:  33 year old female with pain swelling and bruising status post blunt  trauma struck hand on door. EXAM: RIGHT HAND - COMPLETE 3+ VIEW COMPARISON:  None Available. FINDINGS: Three views. Bone mineralization is within normal limits. Distal radius, ulna, carpal bones appear intact. Oblique fracture distal 5th metacarpal with mild volar displacement and over-riding. This is at the distal shaft, extra-articular. Other metacarpals, the phalanges appear intact. Joint spaces normally aligned. IMPRESSION: Oblique and mildly displaced, slightly over-riding fracture of the distal 5th metacarpal shaft. Electronically Signed   By: Odessa Fleming M.D.   On: 03/29/2024 10:01    Procedures Procedures (including critical care time)  Medications Ordered in UC Medications - No data to display  Initial Impression / Assessment and Plan / UC Course  I have reviewed the triage vital signs and the nursing notes.  Pertinent labs & imaging results that were available during my care of the patient were reviewed by me and considered in my medical decision making (see chart for details).     Upon assessment there is swelling and tenderness surrounding the fifth metacarpal of the right hand, with bruising to the dorsal and palmar aspects of the hand.   Based on my interpretation there is a mildly displaced fracture of the fifth metacarpal shaft. Radiology reports confirms this.  Ulnar gutter splint and sling ordered. Given orthopedic follow-up. Prescribed Naproxen for pain. Discussed return precautions.  Final Clinical Impressions(s) / UC Diagnoses   Final diagnoses:  Closed displaced fracture of shaft of fifth metacarpal bone of right hand, initial encounter     Discharge Instructions      We applied a splint in clinic today due to the fracture in your hand as discussed.  Call [facility] to schedule a follow-up appointment for further management of this injury.   You can take Naproxen twice daily as needed for pain. DO not take this with other NSAIDs including  Ibuprofen, Motrin, Aleve, and Advil as this could cause gastrointestinal bleeding. You can take Tylenol as needed for breakthrough pain every 4-6 hours as needed. Do not exceed 4000 mg of Tylenol in a day.   Return here as needed.       ED Prescriptions  Medication Sig Dispense Auth. Provider   naproxen (NAPROSYN) 500 MG tablet Take 1 tablet (500 mg total) by mouth 2 (two) times daily. 30 tablet Wynonia Lawman A, NP      I have reviewed the PDMP during this encounter.   Wynonia Lawman A, NP 03/29/24 1008

## 2024-03-30 ENCOUNTER — Ambulatory Visit (INDEPENDENT_AMBULATORY_CARE_PROVIDER_SITE_OTHER): Payer: MEDICAID | Admitting: Family Medicine

## 2024-03-30 VITALS — BP 102/73 | Ht 61.0 in | Wt 160.0 lb

## 2024-03-30 DIAGNOSIS — S62326D Displaced fracture of shaft of fifth metacarpal bone, right hand, subsequent encounter for fracture with routine healing: Secondary | ICD-10-CM | POA: Diagnosis not present

## 2024-04-01 NOTE — Progress Notes (Signed)
 PCP: Patient, No Pcp Per  Chief Complaint: Rt pinky injury Subjective:   HPI: Patient is a 33 y.o. female here for Right finger injury.  Patient states that she was chasing her dog when her pinky hit something while she was running.  Patient went to the urgent care and was diagnosed with a displaced fracture of the pinky.  Patient was advised to follow-up in the sports medicine clinic.  Patient was given a ulnar gutter splint.  Patient notes no pain at this time.  Patient otherwise has no other concerns..   Past Medical History:  Diagnosis Date   Anxiety    Childhood asthma    Tobacco abuse     Current Outpatient Medications on File Prior to Visit  Medication Sig Dispense Refill   ALPRAZolam (XANAX) 1 MG tablet Take 1 mg by mouth at bedtime as needed for anxiety.     naproxen (NAPROSYN) 500 MG tablet Take 1 tablet (500 mg total) by mouth 2 (two) times daily. 30 tablet 0   No current facility-administered medications on file prior to visit.    Past Surgical History:  Procedure Laterality Date   NO PAST SURGERIES      No Known Allergies  BP 102/73   Ht 5\' 1"  (1.549 m)   Wt 160 lb (72.6 kg)   LMP 03/12/2024 (Approximate)   BMI 30.23 kg/m       No data to display              No data to display              Objective:  Physical Exam:  Gen: NAD, comfortable in exam room  Inspection reveals ulnar gutter splint with cast, no signs of any bleeding of the bandaging on the ulnar gutter splint.   Assessment & Plan:  1. 1. Closed displaced fracture of shaft of fifth metacarpal bone of right hand with routine healing, subsequent encounter (Primary) - Discussed with patient that given her x-ray findings, I do believe she would benefit from seeing a hand surgeon at this time.  Did discuss with patient that we will keep the ulnar gutter splint on as is as the hand surgeon will likely remove it but she will need some protection for now.  Displaced angle does appear to be  in a position which may require further intervention.  Will have patient follow-up with hand surgery.  Patient understanding agreeable with plan. - Ambulatory referral to Orthopedic Surgery    Brenton Grills MD, PGY-4  Sports Medicine Fellow [facility]

## 2024-04-06 ENCOUNTER — Ambulatory Visit: Payer: MEDICAID | Admitting: Orthopedic Surgery

## 2024-04-06 ENCOUNTER — Other Ambulatory Visit (INDEPENDENT_AMBULATORY_CARE_PROVIDER_SITE_OTHER): Payer: MEDICAID

## 2024-04-06 ENCOUNTER — Ambulatory Visit: Payer: MEDICAID | Admitting: Family Medicine

## 2024-04-06 DIAGNOSIS — M79641 Pain in right hand: Secondary | ICD-10-CM

## 2024-04-06 DIAGNOSIS — S62326A Displaced fracture of shaft of fifth metacarpal bone, right hand, initial encounter for closed fracture: Secondary | ICD-10-CM | POA: Diagnosis not present

## 2024-04-06 NOTE — Progress Notes (Signed)
 Kathleen Hicks - 33 y.o. female MRN [id number]  Date of birth: 1991-09-08  Office Visit Note: Visit Date: 04/06/2024 PCP: Patient, No Pcp Per Referred by: No ref. provider found  Subjective: Chief Complaint  Patient presents with   Right Hand - Pain   HPI: Kathleen Hicks is a pleasant 33 y.o. female who presents today for evaluation of a right small finger injury sustained approximately a week and a half prior.  Injury mechanism described as impact to the right small finger while running, chasing her dog.  She believes she hit the hand in a door frame.  She was seen initially in the sports medicine clinic, underwent clinical and radiographic workup which showed a displaced fracture of the small finger metacarpal.  She was placed into an ulnar gutter splint and given close orthopedic hand surgery follow-up for specific hand surgical evaluation.  Pertinent ROS were reviewed with the patient and found to be negative unless otherwise specified above in HPI.   Visit Reason: Right small finger fracture Duration of symptoms: 1.5 weeks Hand dominance: right Occupation: nurse Diabetic: No Smoking: No Heart/Lung History: no Blood Thinners: no  Prior Testing/EMG: no Injections (Date): no Treatments: was placed in splint Prior Surgery: no  Assessment & Plan: Visit Diagnoses:  1. Closed displaced fracture of shaft of fifth metacarpal bone of right hand, initial encounter   2. Pain in right hand     Plan: Extensive discussion was had with the patient today regarding her small finger injury of the right hand.  Repeat x-rays were taken today which do show a significantly displaced fracture of the small finger metacarpal, oblique fracture pattern with notable shortening and angulation.  She also does have a significant rotational abnormality of the small finger with notable overlap between the small and ring finger of the right hand with attempted composite fist.    We discussed conservative  versus surgical treatment modalities.  From a conservative standpoint, we discussed ongoing immobilization, however we discussed that given her rotational abnormality of the small finger, with notable overlap, this would be a poor outcome long-term and would preclude her ability for appropriate grip of the right hand.  From a surgical standpoint, we discussed closed reduction percutaneous pinning of the small finger metacarpal fracture.  I did discuss the possibility of need for open reduction, however this would be unlikely given that the fracture is acute in nature.  Understanding the above, patient would like to proceed with right small finger metacarpal fracture closed reduction and percutaneous pinning at the next available surgical date.  The benefits of this procedure would be to promote fracture healing by providing stability and to heal the fracture in the appropriate alignment. The alternatives of this surgery would be to treat the fracture with immobilization in a splint/brace/cast or to do no intervention. The patient's questions were answered to his satisfaction. After this discussion, patient elected to proceed with surgery. Informed consent was obtained.   Risks and benefits of the procedure were discussed, risks including but not limited to infection, bleeding, scarring, stiffness, nerve injury, tendon injury, vascular injury, hardware complication, malunion, nonunion, ongoing rotational deformity, recurrence of symptoms and need for subsequent operation.  Patient expressed understanding.     Follow-up: No follow-ups on file.   Meds & Orders: No orders of the defined types were placed in this encounter.   Orders Placed This Encounter  Procedures   XR Hand Complete Right     Procedures: No procedures performed  Clinical History: No specialty comments available.  She reports that she has quit smoking. Her smoking use included cigarettes. She has a 1.5 pack-year smoking  history. She has never used smokeless tobacco. No results for input(s): "HGBA1C", "LABURIC" in the last 8760 hours.  Objective:   Vital Signs: LMP 03/12/2024 (Approximate)   Physical Exam  Gen: Well-appearing, in no acute distress; non-toxic CV: Regular Rate. Well-perfused. Warm.  Resp: Breathing unlabored on room air; no wheezing. Psych: Fluid speech in conversation; appropriate affect; normal thought process  Ortho Exam Right hand: - Skin is intact, notable pain and swelling over the small finger metacarpal shaft region - Notable rotational abnormality to the small finger, there is overlap of the ring and small finger with attempted composite fist - Vascularity is intact distally, hand remains warm well-perfused, sensation intact in median radial and ulnar nerve distributions, AIN/PIN/interosseous intact  Imaging: XR Hand Complete Right Result Date: 04/06/2024 X-rays of the right hand demonstrate displaced fracture of the small finger metacarpal, oblique fracture pattern with notable shortening.  MCP joint remains well located in all planes.   Past Medical/Family/Surgical/Social History: Medications & Allergies reviewed per EMR, new medications updated. Patient Active Problem List   Diagnosis Date Noted   Acute pericarditis 12/17/2015   Tachycardia with heart rate 121-140 beats per minute 12/17/2015   Hyperglycemia 12/17/2015   Hyponatremia 12/17/2015   Hypochloremia 12/17/2015   Past Medical History:  Diagnosis Date   Anxiety    Childhood asthma    Tobacco abuse    Family History  Problem Relation Age of Onset   Other Mother        alive and well   Hypertension Mother    Hypertension Maternal Grandmother    Diabetes Maternal Grandmother    Kidney failure Maternal Grandmother        died in her 94's.   Healthy Father    Lupus Neg Hx    Past Surgical History:  Procedure Laterality Date   NO PAST SURGERIES     Social History   Occupational History   Not on  file  Tobacco Use   Smoking status: Former    Current packs/day: 0.25    Average packs/day: 0.3 packs/day for 6.0 years (1.5 ttl pk-yrs)    Types: Cigarettes   Smokeless tobacco: Never  Vaping Use   Vaping status: Every Day  Substance and Sexual Activity   Alcohol use: Yes    Alcohol/week: 0.0 standard drinks of alcohol    Comment: Occasional drink/social.   Drug use: Not Currently    Types: Marijuana    Comment: denies   Sexual activity: Yes    Birth control/protection: None    Ganesh Deeg Merlinda Starling) Marce Sensing, M.D. [facility], Hand Surgery

## 2024-04-08 ENCOUNTER — Other Ambulatory Visit: Payer: Self-pay | Admitting: Orthopedic Surgery

## 2024-04-08 DIAGNOSIS — S62326A Displaced fracture of shaft of fifth metacarpal bone, right hand, initial encounter for closed fracture: Secondary | ICD-10-CM | POA: Diagnosis not present

## 2024-04-16 ENCOUNTER — Telehealth: Payer: Self-pay | Admitting: Orthopedic Surgery

## 2024-04-16 NOTE — Telephone Encounter (Signed)
 I left a message for patient stating Dr. Merlinda Starling wants her to stay at the 2 week mark for post op. He does not have anything available the day before or after, and he does not want to bring her in 2-3 days earlier if at all possible. I did advise if she was having any issues to please call me back so we could discuss.

## 2024-04-16 NOTE — Telephone Encounter (Signed)
 Patient called and ask if you could change her post op to an earlier day. CB#[phone]

## 2024-04-22 ENCOUNTER — Other Ambulatory Visit (INDEPENDENT_AMBULATORY_CARE_PROVIDER_SITE_OTHER): Payer: MEDICAID

## 2024-04-22 ENCOUNTER — Ambulatory Visit: Payer: MEDICAID | Admitting: Orthopedic Surgery

## 2024-04-22 DIAGNOSIS — S62326A Displaced fracture of shaft of fifth metacarpal bone, right hand, initial encounter for closed fracture: Secondary | ICD-10-CM

## 2024-04-22 DIAGNOSIS — M79641 Pain in right hand: Secondary | ICD-10-CM

## 2024-04-22 NOTE — Progress Notes (Signed)
   Blimy Tudor - 34 y.o. female MRN [id number]  Date of birth: 02-07-1991  Office Visit Note: Visit Date: 04/22/2024 PCP: Patient, No Pcp Per Referred by: No ref. provider found  Subjective:  HPI: Bridgete Arrazola is a 33 y.o. female who presents today for follow up 2 weeks status post right small finger closed reduction and percutaneous pinning.  She is doing well overall, has been compliant with the splinting as instructed.  Pain is controlled.  Pertinent ROS were reviewed with the patient and found to be negative unless otherwise specified above in HPI.   Assessment & Plan: Visit Diagnoses:  1. Closed displaced fracture of shaft of fifth metacarpal bone of right hand, initial encounter   2. Pain in right hand     Plan: She is doing well overall, x-rays obtained today show stable appearance of the small finger metacarpal fracture with stable appearance of the pins.  Pin sites are clean and dry on examination today.  Rotation of the digits is appropriate.  We will transition to a short arm ulnar gutter cast today for further protection for an additional 2 weeks.  Pins will remain in place.  She will return in approximate 2 weeks for repeat clinical and radiographic check, at that juncture we will likely remove the pins and transition to a removable splint, my hope is to begin range of motion protocol at that time as well.  Follow-up: No follow-ups on file.   Meds & Orders: No orders of the defined types were placed in this encounter.   Orders Placed This Encounter  Procedures   XR Hand Complete Right   Ambulatory referral to Occupational Therapy     Procedures: No procedures performed       Objective:   Vital Signs: LMP 03/12/2024 (Approximate)   Ortho Exam Right hand: - Digits on appropriate alignment, normal cascade, no rotational abnormalities to the small finger - Pin sites x 2 clean dry and intact, no erythema, no drainage - Gentle digital range of motion is observed today,  limited secondary to immobilization - Sensation intact in all distributions median/radial/ulnar, hand remains warm well-perfused, AIN/PIN/interosseous intact  Imaging: XR Hand Complete Right Result Date: 04/22/2024 X-rays of the right hand show stable appearance of the small finger metacarpal fracture status post pinning, pins remain well-fixed, fracture remains well reduced without evidence of interval displacement or hardware failure.    Marieanne Marxen Alvia Jointer, M.D. [facility], Hand Surgery

## 2024-05-03 ENCOUNTER — Ambulatory Visit: Payer: MEDICAID | Admitting: Orthopedic Surgery

## 2024-05-10 ENCOUNTER — Ambulatory Visit: Payer: MEDICAID | Admitting: Orthopedic Surgery

## 2024-05-10 ENCOUNTER — Encounter: Payer: Self-pay | Admitting: Occupational Therapy

## 2024-05-10 ENCOUNTER — Ambulatory Visit (INDEPENDENT_AMBULATORY_CARE_PROVIDER_SITE_OTHER): Payer: MEDICAID

## 2024-05-10 ENCOUNTER — Ambulatory Visit: Payer: MEDICAID | Attending: Orthopedic Surgery | Admitting: Occupational Therapy

## 2024-05-10 ENCOUNTER — Other Ambulatory Visit: Payer: Self-pay

## 2024-05-10 DIAGNOSIS — S62326A Displaced fracture of shaft of fifth metacarpal bone, right hand, initial encounter for closed fracture: Secondary | ICD-10-CM | POA: Insufficient documentation

## 2024-05-10 DIAGNOSIS — R208 Other disturbances of skin sensation: Secondary | ICD-10-CM | POA: Insufficient documentation

## 2024-05-10 DIAGNOSIS — R278 Other lack of coordination: Secondary | ICD-10-CM | POA: Diagnosis present

## 2024-05-10 DIAGNOSIS — R29898 Other symptoms and signs involving the musculoskeletal system: Secondary | ICD-10-CM | POA: Diagnosis present

## 2024-05-10 DIAGNOSIS — M6281 Muscle weakness (generalized): Secondary | ICD-10-CM | POA: Insufficient documentation

## 2024-05-10 DIAGNOSIS — M79641 Pain in right hand: Secondary | ICD-10-CM | POA: Diagnosis present

## 2024-05-10 NOTE — Progress Notes (Signed)
   Kathleen Hicks - 33 y.o. female MRN [id number]  Date of birth: 16-Aug-1991  Office Visit Note: Visit Date: 05/10/2024 PCP: Patient, No Pcp Per Referred by: No ref. provider found  Subjective:  HPI: Kathleen Hicks is a 33 y.o. female who presents today for follow up 4 weeks status post right small finger closed reduction and percutaneous pinning metacarpal shaft fracture.    Pertinent ROS were reviewed with the patient and found to be negative unless otherwise specified above in HPI.   Assessment & Plan: Visit Diagnoses:  1. Closed displaced fracture of shaft of fifth metacarpal bone of right hand, initial encounter     Plan: Pins removed today, x-rays demonstrate appropriate healing of the metacarpal fracture.  At this juncture she will be transition to a removable orthosis, ulnar gutter with occupational therapy.  She can begin range of motion exercises of the hand with both active and passive range of motion.  Refrain from weightbearing initially.  I will plan on seeing her back in 4 weeks for repeat clinical and radiographic check, at that juncture we will likely initiate weightbearing exercises.  Follow-up: No follow-ups on file.   Meds & Orders: No orders of the defined types were placed in this encounter.   Orders Placed This Encounter  Procedures   XR Hand Complete Right     Procedures: No procedures performed       Objective:   Vital Signs: There were no vitals taken for this visit.  Ortho Exam Right hand: - Pin sites clean dry and intact, pins removed today x 2 - Normal cascade to all digits, no rotational abnormality of the small finger - Sensation intact distally, small finger with appropriate color and capillary refill distally  Imaging: XR Hand Complete Right Result Date: 05/10/2024 X-rays of the right hand demonstrate stable appearance of the small finger metacarpal fracture with appropriate interval healing, pins remain well-fixed in all planes    Nili Honda Merlinda Starling)  Briani Maul, M.D. [facility], Hand Surgery

## 2024-05-10 NOTE — Therapy (Addendum)
 OUTPATIENT OCCUPATIONAL THERAPY NEURO EVALUATION  Patient Name: Kathleen Hicks MRN: [id number] DOB:08/18/1991, 33 y.o., female Today's Date: 05/10/2024  PCP: No PCP per pt REFERRING PROVIDER: Merrill Abide, MD   END OF SESSION:  OT End of Session - 05/10/24 1540     Visit Number 1    Number of Visits 13   including eval   Date for OT Re-Evaluation 07/02/24    Authorization Type [employer], VL: 27, Auth required    OT Start Time 1411    OT Stop Time 1530    OT Time Calculation (min) 79 min    Activity Tolerance Patient tolerated treatment well;Patient limited by pain    Behavior During Therapy [facility] for tasks assessed/performed             Past Medical History:  Diagnosis Date   Anxiety    Childhood asthma    Tobacco abuse    Past Surgical History:  Procedure Laterality Date   NO PAST SURGERIES     Patient Active Problem List   Diagnosis Date Noted   Acute pericarditis 12/17/2015   Tachycardia with heart rate 121-140 beats per minute 12/17/2015   Hyperglycemia 12/17/2015   Hyponatremia 12/17/2015   Hypochloremia 12/17/2015    ONSET DATE: 04/22/2024 (referral date), approx. 03/27/24 (initial injury date), 04/08/24 (date of surgery - R small finger metacarpal shaft fx closed reduction and percutaneous pinning)  REFERRING DIAG: W11.914N (ICD-10-CM) - Closed displaced fracture of shaft of fifth metacarpal bone of right hand, initial encounter   THERAPY DIAG:  Other symptoms and signs involving the musculoskeletal system - Plan: Ot plan of care cert/re-cert  Muscle weakness (generalized) - Plan: Ot plan of care cert/re-cert  Other lack of coordination - Plan: Ot plan of care cert/re-cert  Rationale for Evaluation and Treatment: Rehabilitation  SUBJECTIVE:   SUBJECTIVE STATEMENT: Pt's name pronounced: "Shah - nai"  Pt had percutaneous pins removed this morning. Pt reported pain when pins were removed. Pt had cast on though cast was also removed this morning.  Pt reported some "tenderness" of R wrist on medial side with wrist ext movement.   Pt accompanied by: self (pt drove self)  PERTINENT HISTORY: Closed displaced fracture of shaft of fifth metacarpal bone of right hand, anxiety, tobacco abuse  03/29/24 ED Provider Notes: Mechanism of injury: pt "running out her back door and hit her hand on the door and has had increased pain and swelling since then"  Per 04/22/24 Dr Merlinda Starling Progress Note: "She is doing well overall, x-rays obtained today show stable appearance of the small finger metacarpal fracture with stable appearance of the pins. Pin sites are clean and dry on examination today. Rotation of the digits is appropriate. We will transition to a short arm ulnar gutter cast today for further protection for an additional 2 weeks. Pins will remain in place. She will return in approximate 2 weeks for repeat clinical and radiographic check, at that juncture we will likely remove the pins and transition to a removable splint, my hope is to begin range of motion protocol at that time as well"  Imaging: XR Hand Complete Right Result Date: 04/22/2024 X-rays of the right hand show stable appearance of the small finger metacarpal fracture status post pinning, pins remain well-fixed, fracture remains well reduced without evidence of interval displacement or hardware failure  PRECAUTIONS: Continue POC per [place]  Hand Protocol 5th Edition. Per 05/10/24 Dr Merlinda Starling Office Visit: "At this juncture she will be transition to a removable orthosis, ulnar  gutter with occupational therapy. She can begin range of motion exercises of the hand with both active and passive range of motion. Refrain from weightbearing initially. I will plan on seeing her back in 4 weeks for repeat clinical and radiographic check, at that juncture we will likely initiate weightbearing exercises."  WEIGHT BEARING RESTRICTIONS: Yes NWB RUE initially  PAIN:  Are you having pain? Yes: NPRS scale: 5/10 Pain  location: R MCP joint Pain description: aching, dull, sharp and throbbing sometimes (depends on movement) Aggravating factors: holding hand down then moving hand to elevated Relieving factors: elevating hand, medication  FALLS: Has patient fallen in last 6 months? No  LIVING ENVIRONMENT: Lives with: lives with their family Lives in: House/apartment Stairs: Yes: Internal: 10 steps; on left going up   PLOF: Independent with basic ADLs, Occupation: CNA work, Education officer, community (requires writing/typing for note-taking)  PATIENT GOALS: "to get my pinky movement back" and "be able to grab something without my fingers being stuck straight"  OBJECTIVE:  Note: Objective measures were completed at Evaluation unless otherwise noted.  HAND DOMINANCE: Right  ADLs: Overall ADLs: compensating with L hand Transfers/ambulation related to ADLs: Eating: ind with L hand Grooming: ind with L hand UB Dressing: some difficulty with long-sleeves, buttons, and zippers LB Dressing: ind, difficulty with tying shoes Toileting: ind Bathing: difficulty, using L hand to compensate  IADLs: Shopping: difficulty, compensating with L hand Light housekeeping: difficulty, family assists Meal Prep: difficulty, using pointer finger and middle finger carefully Community mobility: currently driving, no concerns  Handwriting: impaired, fair legibility though pt reported pain  Occupation: CNA (ADLs/caregiving role, 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  MOBILITY STATUS: Independent  POSTURE COMMENTS:  Ind sitting posture  ACTIVITY TOLERANCE: Activity tolerance: no change  FUNCTIONAL OUTCOME MEASURES: QuickDASH: 61.4% deficit    UPPER EXTREMITY ROM:    Active ROM  Right eval Left eval  Shoulder flexion [facility]  Shoulder abduction    Shoulder adduction    Shoulder extension    Shoulder internal rotation    Shoulder external rotation    Elbow flexion [facility] WFL  Elbow extension [facility] WFL  Wrist flexion [facility] WFL  Wrist extension WFL ("tender")   Wrist ulnar deviation    Wrist radial deviation    Wrist pronation WFL   Wrist supination WFL   (Blank rows = not tested)  Digit 1-3 of RUE: WFL Digit 4-5 of RUE: impaired at DIP, PIP, and MCP. Digit 5 limitations > digit 4 limitations.  UPPER EXTREMITY MMT:   Not tested per current precautions  MMT Right eval Left eval  Shoulder flexion    Shoulder abduction    Shoulder adduction    Shoulder extension    Shoulder internal rotation    Shoulder external rotation    Middle trapezius    Lower trapezius    Elbow flexion    Elbow extension    Wrist flexion    Wrist extension    Wrist ulnar deviation    Wrist radial deviation    Wrist pronation    Wrist supination    (Blank rows = not tested)  HAND FUNCTION: Not tested per current precautions  COORDINATION: Not tested per current precautions  SENSATION: Pt denied numbness/tingling of affected UE  EDEMA: mild at affected wrist  MUSCLE TONE: RUE: Within functional limits  and LUE: Within functional limits  COGNITION: Overall cognitive status: Within functional limits for tasks assessed   OBSERVATIONS: Pt was pleasant. Pt limited by pain and stiffness of affected hand. Pt had bandage over wound sites where pins were removed earlier today. Bandage had some blood from recent removal of pins though wounds were now clotted and no s/s of infection noted. Pt has long nails of B hands though reported intent to go to nail salon tomorrow to cut nails.   Next appointment with Dr. Merlinda Starling: 06/07/24                                                                                                                             TREATMENT DATE:     Orthotics OT fabricated forearm-based ulnar gutter splint incorporating affected UE digit 4-5 with MCP joints in slight flex and wrist in slight ext. PIP and DIP joints free.   OT educated pt on donning/doffing splint. Pt returned demo.  OT provided pt with x3 stockinette, x3 additional Velcro straps, x2 plastic bags for bathing tasks, x3 new bandages.  Self-Care OT educated pt on Wound care, monitoring for s/s of infection, splint wear/care (see pt instructions), sleep positioning strategies (see pt instructions), UE anatomy, protocol, current precautions per Dr. Merlinda Starling, dx, prognosis, A/E option of voice-to-text to take notes for college classes. Pt acknowledged understanding of all.  TherEx OT initiated initial HEP - in-splint digit 1-3 full AROM flex/ext, digit 4-5 PIP/DIP AROM flex/ext (hook fist). Pt returned demo: Access Code: [email or web address] URL: [email or web address] Date: 05/10/2024 Prepared by: Daina Drum  Exercises - Seated Wrist Flexor Hook Fist Tendon Gliding  - 4-5 x daily - 2 sets - 10 reps    PATIENT EDUCATION: Education details: see today's tx above Person educated: Patient Education method: Explanation, Demonstration, and Handouts Education comprehension: verbalized understanding, returned demonstration, and needs further education  HOME EXERCISE PROGRAM: 05/10/24 - initial HEP: in-splint digit 1-3 full AROM flex/ext, digit 4-5 PIP/DIP AROM flex/ext (hook fist). Pt returned demo: Access Code: [email or web address]   GOALS: Goals reviewed with patient? Yes  SHORT TERM GOALS: Target date: 06/11/24  Pt will return demo of affected UE HEP Baseline: initial ROM initiated at eval Goal status: INITIAL  2.  Pt will demo full composite flex ROM of affected UE pending precautions and pain threshold. Baseline: impaired digit 4-5 ROM at MCP, PIP, and DIP joints of affected hand Goal status: INITIAL  3.  Pt will verbalize understanding of compensatory strategies and A/E  options for handwriting and typing tasks as needed for participation in college classes.  Baseline: pt reported difficulty with handwriting and typing Goal status: INITIAL  4.  Pt will verbalize understanding of sleep positioning strategies and pain management strategies.  Baseline: sleep positioning handout and education provided at eval Goal status: INITIAL  5. Pt will verbalize and/or demo understanding of joint protection strategies and principles during functional tasks.  Baseline: New to outpt OT  Goal status:  INITIAL  LONG TERM GOALS: Target date: 07/02/24  Patient will demonstrate at least 16% improvement with quick Dash score (reporting 45.4% disability or less) indicating improved functional use of affected extremity.  Baseline: QuickDASH: 61.4% deficit Goal status: INITIAL  2.  Patient will demo improved FM coordination as evidenced by completing nine-hole peg with use of RUE in 30 seconds or less pending precautions.  Baseline: TBD d/t current precautions Goal status: INITIAL  3.  Patient will demonstrate at least 30 lbs R UE grip strength as needed to open jars and other containers pending precautions.  Baseline: TBD d/t current precautions Goal status: INITIAL  4.  Pt will verbalize understanding of A/E, adaptive strategies, and joint protection strategies as needed for return-to-work consideration and completion of college assignments.  Baseline: pt currently not working d/t injury, pt reported difficulty with typing and handwriting for college classes Goal status: INITIAL  ASSESSMENT:  CLINICAL IMPRESSION: Patient is a 33 y.o. female who was seen today for occupational therapy evaluation for Closed displaced fracture of shaft of fifth metacarpal bone of right hand. Hx includes anxiety, tobacco abuse. Patient currently presents below baseline level of functioning demonstrating functional deficits and impairments as noted below. Pt would benefit from skilled OT services  in the outpatient setting to work on impairments as noted below to help pt return to PLOF as able.     PERFORMANCE DEFICITS: in functional skills including ADLs, IADLs, coordination, dexterity, proprioception, sensation, edema, ROM, strength, pain, flexibility, Fine motor control, body mechanics, endurance, wound, and UE functional use, cognitive skills including none, and psychosocial skills including environmental adaptation.   IMPAIRMENTS: are limiting patient from ADLs, IADLs, rest and sleep, education, work, leisure, and social participation.   CO-MORBIDITIES: has no other co-morbidities that affects occupational performance. Patient will benefit from skilled OT to address above impairments and improve overall function.  MODIFICATION OR ASSISTANCE TO COMPLETE EVALUATION: Min-Moderate modification of tasks or assist with assess necessary to complete an evaluation.  OT OCCUPATIONAL PROFILE AND HISTORY: Detailed assessment: Review of records and additional review of physical, cognitive, psychosocial history related to current functional performance.  CLINICAL DECISION MAKING: Moderate - several treatment options, min-mod task modification necessary  REHAB POTENTIAL: Good  EVALUATION COMPLEXITY: Moderate    PLAN:  OT FREQUENCY: 2x/week  OT DURATION: 6 weeks  PLANNED INTERVENTIONS: 97168 OT Re-evaluation, 97535 self care/ADL training, 56213 therapeutic exercise, 97530 therapeutic activity, 97112 neuromuscular re-education, 97140 manual therapy, 97035 ultrasound, 97018 paraffin, 08657 fluidotherapy, 97010 moist heat, 97010 cryotherapy, 97032 electrical stimulation (manual), 97014 electrical stimulation unattended, 97760 Orthotic Initial, 97761 Prosthetic Initial, 97763 Orthotic/Prosthetic subsequent, manual lymph drainage, scar mobilization, passive range of motion, functional mobility training, compression bandaging, energy conservation, patient/family education, and DME and/or AE  instructions  RECOMMENDED OTHER SERVICES: N/A  CONSULTED AND AGREED WITH PLAN OF CARE: Patient  PLAN FOR NEXT SESSION:  Add additional ROM exercises per protocol and per Dr. [employer] instructions - attend to pain threshold Splint adjustment PRN Joint protection strategies during tasks Handwriting/typing compensation strategies as needed for college classes - per precautions   For all possible CPT codes, reference the Planned Interventions line above.     Check all conditions that are expected to impact treatment: {Conditions expected to impact treatment:Musculoskeletal disorders   If treatment provided at initial evaluation, no treatment charged due to lack of authorization.        Oakley Bellman, OT 05/10/2024, 4:17 PM

## 2024-05-10 NOTE — Patient Instructions (Addendum)
   WEARING SCHEDULE:  Wear splint at ALL times except for hygiene care (May remove splint for exercises and then immediately place back on ONLY if directed by the therapist)  PURPOSE:  To prevent movement and for protection until injury can heal  CARE OF SPLINT:  Keep splint away from heat sources including: stove, radiator or furnace, or a car in sunlight. The splint can melt and will no longer fit you properly  Keep away from pets and children  Clean the splint with rubbing alcohol 1-2 times per day.  * During this time, make sure you also clean your hand/arm as instructed by your therapist and/or perform dressing changes as needed. Then dry hand/arm completely before replacing splint. (When cleaning hand/arm, keep it immobilized in same position until splint is replaced)  PRECAUTIONS/POTENTIAL PROBLEMS: *If you notice or experience increased pain, swelling, numbness, or a lingering reddened area from the splint: Contact your therapist immediately by calling [phone]. You must wear the splint for protection, but we will get you scheduled for adjustments as quickly as possible.  (If only straps or hooks need to be replaced and NO adjustments to the splint need to be made, just call the office ahead and let them know you are coming in)  If you have any medical concerns or signs of infection, please call your doctor immediately

## 2024-05-11 ENCOUNTER — Encounter (HOSPITAL_COMMUNITY): Payer: Self-pay

## 2024-05-11 ENCOUNTER — Ambulatory Visit (HOSPITAL_COMMUNITY)
Admission: EM | Admit: 2024-05-11 | Discharge: 2024-05-11 | Disposition: A | Discharge status: Home / Self Care | Payer: MEDICAID | Attending: Nurse Practitioner | Admitting: Nurse Practitioner

## 2024-05-11 DIAGNOSIS — N939 Abnormal uterine and vaginal bleeding, unspecified: Secondary | ICD-10-CM | POA: Diagnosis present

## 2024-05-11 DIAGNOSIS — Z113 Encounter for screening for infections with a predominantly sexual mode of transmission: Secondary | ICD-10-CM | POA: Insufficient documentation

## 2024-05-11 DIAGNOSIS — Z3202 Encounter for pregnancy test, result negative: Secondary | ICD-10-CM | POA: Diagnosis not present

## 2024-05-11 DIAGNOSIS — Z202 Contact with and (suspected) exposure to infections with a predominantly sexual mode of transmission: Secondary | ICD-10-CM | POA: Diagnosis present

## 2024-05-11 LAB — POCT URINALYSIS DIP (MANUAL ENTRY)
Bilirubin, UA: NEGATIVE
Blood, UA: NEGATIVE
Glucose, UA: NEGATIVE mg/dL
Ketones, POC UA: NEGATIVE mg/dL
Leukocytes, UA: NEGATIVE
Nitrite, UA: NEGATIVE
Protein Ur, POC: NEGATIVE mg/dL
Spec Grav, UA: 1.02 (ref 1.010–1.025)
Urobilinogen, UA: 0.2 U/dL — AB
pH, UA: 6 (ref 5.0–8.0)

## 2024-05-11 LAB — POCT URINE PREGNANCY: Preg Test, Ur: NEGATIVE

## 2024-05-11 NOTE — ED Triage Notes (Signed)
 Pt states vaginal bleeding on and off for the past 2 weeks. States it is not her period.  Pt would also liked to be tested for STD's.

## 2024-05-11 NOTE — ED Provider Notes (Signed)
 [facility]    CSN: [id number] Arrival date & time: 05/11/24  4098      History   Chief Complaint Chief Complaint  Patient presents with   Vaginal Bleeding    HPI Kathleen Hicks is a 33 y.o. female.   Kathleen Hicks is a 33 y.o. female that presents with abnormal vaginal bleeding that started after her normal menstrual cycle ended around May 5th or 6th. The patient reports her typical menstrual cycle lasts 4 days. She also complains of pain in the left lower back. The current bleeding is described as red blood, similar to a period, without clots or saturating a pad/tampon within an hour. The bleeding is intermittent over the past couple of weeks. She notes that there was no blood when she did the self swab. She denies pain or burning during urination, genital sores, mouth sores, discharge, itching, or irritation. The patient is sexually active, reporting two partners in the past three months - one female and one female. She uses condoms with her female partner. The patient expresses concern about potentially dirty sex toys causing issues. She is not currently on birth control. The patient denies a history of kidney stones, fevers, nausea, or vomiting. She has no regular gynecologist but reports getting pap smears at [facility].  The following portions of the patient's history were reviewed and updated as appropriate: allergies, current medications, past family history, past medical history, past social history, past surgical history, and problem list.      Past Medical History:  Diagnosis Date   Anxiety    Childhood asthma    Tobacco abuse     Patient Active Problem List   Diagnosis Date Noted   Acute pericarditis 12/17/2015   Tachycardia with heart rate 121-140 beats per minute 12/17/2015   Hyperglycemia 12/17/2015   Hyponatremia 12/17/2015   Hypochloremia 12/17/2015    Past Surgical History:  Procedure Laterality Date   NO PAST SURGERIES      OB History    No obstetric history on file.      Home Medications    Prior to Admission medications   Medication Sig Start Date End Date Taking? Authorizing Provider  ALPRAZolam (XANAX) 1 MG tablet Take 1 mg by mouth at bedtime as needed for anxiety.    [provider]  naproxen  (NAPROSYN ) 500 MG tablet Take 1 tablet (500 mg total) by mouth 2 (two) times daily. 03/29/24   Karon Packer, NP    Family History Family History  Problem Relation Age of Onset   Other Mother        alive and well   Hypertension Mother    Hypertension Maternal Grandmother    Diabetes Maternal Grandmother    Kidney failure Maternal Grandmother        died in her 36's.   Healthy Father    Lupus Neg Hx     Social History Social History   Tobacco Use   Smoking status: Former    Current packs/day: 0.25    Average packs/day: 0.3 packs/day for 6.0 years (1.5 ttl pk-yrs)    Types: Cigarettes   Smokeless tobacco: Never  Vaping Use   Vaping status: Every Day  Substance Use Topics   Alcohol use: Yes    Alcohol/week: 0.0 standard drinks of alcohol    Comment: Occasional drink/social.   Drug use: Not Currently    Types: Marijuana    Comment: denies     Allergies   Patient has no known  allergies.   Review of Systems Review of Systems  Constitutional:  Negative for fever.  Gastrointestinal:  Negative for abdominal pain, nausea and vomiting.  Genitourinary:  Positive for menstrual problem and vaginal bleeding. Negative for dysuria, frequency, pelvic pain, vaginal discharge and vaginal pain.  Musculoskeletal:  Positive for back pain (left lower).  All other systems reviewed and are negative.    Physical Exam Triage Vital Signs ED Triage Vitals  Encounter Vitals Group     BP 05/11/24 0859 121/69     Systolic BP Percentile --      Diastolic BP Percentile --      Pulse Rate 05/11/24 0859 (!) 54     Resp 05/11/24 0859 16     Temp 05/11/24 0859 97.8 F (36.6 C)     Temp Source 05/11/24 0859  Oral     SpO2 05/11/24 0859 98 %     Weight --      Height --      Head Circumference --      Peak Flow --      Pain Score 05/11/24 0901 0     Pain Loc --      Pain Education --      Exclude from Growth Chart --    No data found.  Updated Vital Signs BP 121/69 (BP Location: Left Arm)   Pulse (!) 54   Temp 97.8 F (36.6 C) (Oral)   Resp 16   LMP 04/22/2024 (Approximate)   SpO2 98%   Visual Acuity Right Eye Distance:   Left Eye Distance:   Bilateral Distance:    Right Eye Near:   Left Eye Near:    Bilateral Near:     Physical Exam Vitals and nursing note reviewed.  Constitutional:      General: She is not in acute distress.    Appearance: Normal appearance. She is not ill-appearing, toxic-appearing or diaphoretic.  HENT:     Head: Normocephalic.     Nose: Nose normal.     Mouth/Throat:     Mouth: Mucous membranes are moist.  Eyes:     Conjunctiva/sclera: Conjunctivae normal.  Cardiovascular:     Rate and Rhythm: Normal rate.  Pulmonary:     Effort: Pulmonary effort is normal.  Abdominal:     General: Bowel sounds are normal.     Palpations: Abdomen is soft.     Tenderness: There is no abdominal tenderness. There is no right CVA tenderness or left CVA tenderness.  Genitourinary:    Comments: Deferred; patient performed self-swab for Aptima testing  Musculoskeletal:        General: Normal range of motion.     Cervical back: Normal range of motion and neck supple.  Skin:    General: Skin is warm and dry.  Neurological:     General: No focal deficit present.     Mental Status: She is alert and oriented to person, place, and time.  Psychiatric:        Mood and Affect: Mood normal.        Behavior: Behavior normal.      UC Treatments / Results  Labs (all labs ordered are listed, but only abnormal results are displayed) Labs Reviewed  POCT URINALYSIS DIP (MANUAL ENTRY) - Abnormal; Notable for the following components:      Result Value   Clarity, UA  cloudy (*)    Urobilinogen, UA 0.2 (*)    All other components within normal limits  POCT URINE PREGNANCY -  Normal  CERVICOVAGINAL ANCILLARY ONLY    EKG   Radiology XR Hand Complete Right Result Date: 05/10/2024 X-rays of the right hand demonstrate stable appearance of the small finger metacarpal fracture with appropriate interval healing, pins remain well-fixed in all planes   Procedures Procedures (including critical care time)  Medications Ordered in UC Medications - No data to display  Initial Impression / Assessment and Plan / UC Course  I have reviewed the triage vital signs and the nursing notes.  Pertinent labs & imaging results that were available during my care of the patient were reviewed by me and considered in my medical decision making (see chart for details).    33 yo female presents with abnormal vaginal bleeding. The bleeding has been intermittent over the past couple of weeks. Urine pregnancy test is negative, and urinalysis does not suggest a urinary tract infection. Genitourinary exam was deferred; patient completed a self vaginal swab for Aptima testing; results are pending. She was advised to abstain from sexual activity until testing is resulted and, if necessary, treatment is completed. She was also instructed to monitor the bleeding and seek emergent care if she develops heavy vaginal bleeding, severe abdominal or pelvic pain, fever, or signs of hemodynamic instability.  Today's evaluation has revealed no signs of a dangerous process. Discussed diagnosis with patient and/or guardian. Patient and/or guardian aware of their diagnosis, possible red flag symptoms to watch out for and need for close follow up. Patient and/or guardian understands verbal and written discharge instructions. Patient and/or guardian comfortable with plan and disposition.  Patient and/or guardian has a clear mental status at this time, good insight into illness (after discussion and  teaching) and has clear judgment to make decisions regarding their care  Documentation was completed with the aid of voice recognition software. Transcription may contain typographical errors. Final Clinical Impressions(s) / UC Diagnoses   Final diagnoses:  Abnormal uterine bleeding (AUB)     Discharge Instructions      Your urine pregnancy test was negative, and your urine sample did not show signs of a urinary tract infection. You completed a self-swab for STI testing, and we will contact you with the results once they are available. Please avoid any sexual activity until your test results are back and, if needed, treatment is completed. Mild bleeding between menstrual cycles can be normal and is often not a cause for concern. However, continue to monitor your bleeding closely. If you experience heavy vaginal bleeding, severe abdominal or pelvic pain, fever, or feel faint or dizzy, seek emergency care immediately.    ED Prescriptions   None    PDMP not reviewed this encounter.   Beola [place], [place] 05/11/24 [phone]

## 2024-05-11 NOTE — Discharge Instructions (Addendum)
 Your urine pregnancy test was negative, and your urine sample did not show signs of a urinary tract infection. You completed a self-swab for STI testing, and we will contact you with the results once they are available. Please avoid any sexual activity until your test results are back and, if needed, treatment is completed. Mild bleeding between menstrual cycles can be normal and is often not a cause for concern. However, continue to monitor your bleeding closely. If you experience heavy vaginal bleeding, severe abdominal or pelvic pain, fever, or feel faint or dizzy, seek emergency care immediately.

## 2024-05-12 ENCOUNTER — Ambulatory Visit (HOSPITAL_COMMUNITY): Payer: Self-pay

## 2024-05-12 LAB — CERVICOVAGINAL ANCILLARY ONLY
Bacterial Vaginitis (gardnerella): POSITIVE — AB
Candida Glabrata: NEGATIVE
Candida Vaginitis: NEGATIVE
Chlamydia: NEGATIVE
Comment: NEGATIVE
Comment: NEGATIVE
Comment: NEGATIVE
Comment: NEGATIVE
Comment: NEGATIVE
Comment: NORMAL
Neisseria Gonorrhea: NEGATIVE
Trichomonas: NEGATIVE

## 2024-05-12 MED ORDER — METRONIDAZOLE 500 MG PO TABS: 500.0000 mg | ORAL_TABLET | Freq: Two times a day (BID) | ORAL | 0 refills | Status: AC

## 2024-05-19 ENCOUNTER — Ambulatory Visit: Payer: MEDICAID | Admitting: Occupational Therapy

## 2024-05-19 DIAGNOSIS — M6281 Muscle weakness (generalized): Secondary | ICD-10-CM

## 2024-05-19 DIAGNOSIS — R29898 Other symptoms and signs involving the musculoskeletal system: Secondary | ICD-10-CM | POA: Diagnosis not present

## 2024-05-19 DIAGNOSIS — M79641 Pain in right hand: Secondary | ICD-10-CM

## 2024-05-19 DIAGNOSIS — R278 Other lack of coordination: Secondary | ICD-10-CM

## 2024-05-19 DIAGNOSIS — R208 Other disturbances of skin sensation: Secondary | ICD-10-CM

## 2024-05-19 NOTE — Therapy (Unsigned)
 OUTPATIENT OCCUPATIONAL THERAPY ORTHO TREATMENT  Patient Name: Kathleen Hicks MRN: [id number] DOB:07-04-1991, 33 y.o., female Today's Date: 05/19/2024  PCP: No PCP per pt REFERRING PROVIDER: Merrill Abide, MD   END OF SESSION:  OT End of Session - 05/19/24 0801     Visit Number 2    Number of Visits 13   including eval   Date for OT Re-Evaluation 07/02/24    Authorization Type [employer], VL: 27, Auth required    OT Start Time 0801    OT Stop Time 0845    OT Time Calculation (min) 44 min    Activity Tolerance Patient tolerated treatment well;Patient limited by pain    Behavior During Therapy [facility] for tasks assessed/performed             Past Medical History:  Diagnosis Date   Anxiety    Childhood asthma    Tobacco abuse    Past Surgical History:  Procedure Laterality Date   NO PAST SURGERIES     Patient Active Problem List   Diagnosis Date Noted   Acute pericarditis 12/17/2015   Tachycardia with heart rate 121-140 beats per minute 12/17/2015   Hyperglycemia 12/17/2015   Hyponatremia 12/17/2015   Hypochloremia 12/17/2015    ONSET DATE: 04/22/2024 (referral date), approx. 03/27/24 (initial injury date), 04/08/24 (date of surgery - R small finger metacarpal shaft fx closed reduction and percutaneous pinning)  REFERRING DIAG: W09.811B (ICD-10-CM) - Closed displaced fracture of shaft of fifth metacarpal bone of right hand, initial encounter   THERAPY DIAG:  Muscle weakness (generalized)  Other lack of coordination  Pain in right hand  Other disturbances of skin sensation  Rationale for Evaluation and Treatment: Rehabilitation  SUBJECTIVE:   SUBJECTIVE STATEMENT: Pt's name pronounced: "Mason Sole - nai"  Pt reports things are going okay but some minor adjustments would be helpful for her splint ie) some discomfort on back of ring finger from splint.  She asked about getting the hand wet and is reassured she can wash the hand.   Pt accompanied by: self +  Significant other  PERTINENT HISTORY: Closed displaced fracture of shaft of fifth metacarpal bone of right hand, anxiety, tobacco abuse  03/29/24 ED Provider Notes: Mechanism of injury: pt "running out her back door and hit her hand on the door and has had increased pain and swelling since then"  Per 04/22/24 Dr Merlinda Starling Progress Note: "She is doing well overall, x-rays obtained today show stable appearance of the small finger metacarpal fracture with stable appearance of the pins. Pin sites are clean and dry on examination today. Rotation of the digits is appropriate. We will transition to a short arm ulnar gutter cast today for further protection for an additional 2 weeks. Pins will remain in place. She will return in approximate 2 weeks for repeat clinical and radiographic check, at that juncture we will likely remove the pins and transition to a removable splint, my hope is to begin range of motion protocol at that time as well"  Imaging: XR Hand Complete Right Result Date: 04/22/2024 X-rays of the right hand show stable appearance of the small finger metacarpal fracture status post pinning, pins remain well-fixed, fracture remains well reduced without evidence of interval displacement or hardware failure  PRECAUTIONS: Continue POC per [place]  Hand Protocol 5th Edition. Per 05/10/24 Dr Merlinda Starling Office Visit: "At this juncture she will be transition to a removable orthosis, ulnar gutter with occupational therapy. She can begin range of motion exercises of the hand with both  active and passive range of motion. Refrain from weightbearing initially. I will plan on seeing her back in 4 weeks for repeat clinical and radiographic check, at that juncture we will likely initiate weightbearing exercises."  WEIGHT BEARING RESTRICTIONS: Yes NWB RUE initially  PAIN:  Are you having pain? Yes: NPRS scale: 4 (6 at worst)/10 Pain location: R MCP joint Pain description: aching/irritating Aggravating factors: holding hand  down then moving hand to elevated Relieving factors: elevating hand, medication (for back 4x/day)  FALLS: Has patient fallen in last 6 months? No  LIVING ENVIRONMENT: Lives with: lives with their family Lives in: House/apartment Stairs: Yes: Internal: 10 steps; on left going up   PLOF: Independent with basic ADLs, Occupation: CNA work, Education officer, community (requires writing/typing for note-taking)  PATIENT GOALS: "to get my pinky movement back" and "be able to grab something without my fingers being stuck straight"  OBJECTIVE:  Note: Objective measures were completed at Evaluation unless otherwise noted.  HAND DOMINANCE: Right  ADLs: Overall ADLs: compensating with L hand Transfers/ambulation related to ADLs: Eating: ind with L hand Grooming: ind with L hand UB Dressing: some difficulty with long-sleeves, buttons, and zippers LB Dressing: ind, difficulty with tying shoes Toileting: ind Bathing: difficulty, using L hand to compensate  IADLs: Shopping: difficulty, compensating with L hand Light housekeeping: difficulty, family assists Meal Prep: difficulty, using pointer finger and middle finger carefully Community mobility: currently driving, no concerns  Handwriting: impaired, fair legibility though pt reported pain  Occupation: CNA (ADLs/caregiving role, 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  MOBILITY STATUS: Independent  POSTURE COMMENTS:  Ind sitting posture  ACTIVITY TOLERANCE: Activity tolerance: no change  FUNCTIONAL OUTCOME MEASURES: QuickDASH: 61.4% deficit    UPPER EXTREMITY ROM:    Active ROM Right eval Left eval  Shoulder flexion [facility]  Shoulder abduction    Shoulder adduction    Shoulder extension     Shoulder internal rotation    Shoulder external rotation    Elbow flexion [facility] WFL  Elbow extension [facility] WFL  Wrist flexion [facility] WFL  Wrist extension WFL ("tender")   Wrist ulnar deviation    Wrist radial deviation    Wrist pronation WFL   Wrist supination WFL   (Blank rows = not tested)  Digit 1-3 of RUE: WFL Digit 4-5 of RUE: impaired at DIP, PIP, and MCP. Digit 5 limitations > digit 4 limitations.  UPPER EXTREMITY MMT:   Not tested per current precautions  MMT Right eval Left eval  Shoulder flexion    Shoulder abduction    Shoulder adduction    Shoulder extension    Shoulder internal rotation    Shoulder external rotation    Middle trapezius    Lower trapezius    Elbow flexion    Elbow extension    Wrist flexion    Wrist extension    Wrist ulnar deviation    Wrist radial deviation    Wrist pronation    Wrist supination    (Blank rows = not tested)  HAND FUNCTION: Not tested per current precautions  COORDINATION: Not tested per current precautions  SENSATION: Pt denied numbness/tingling of affected UE  EDEMA: mild at affected wrist  MUSCLE TONE: RUE: Within functional limits and LUE: Within functional limits  COGNITION: Overall cognitive status: Within functional limits for tasks assessed  OBSERVATIONS: Pt was pleasant. Pt limited by pain and stiffness of affected hand. Pt had bandage over wound sites where pins were removed earlier today. Bandage had some blood from recent removal of pins though wounds were now clotted and no s/s of infection noted. Pt has long nails of B hands though reported intent to go to nail salon tomorrow to cut nails.   Next appointment with Dr. Merlinda Starling: 06/07/24                                                                                                                           TODAY'S TREATMENT:    Orthotics OT provided slight adjustments to forearm-based ulnar gutter splint incorporating affected R UE pinkie and buddy'd to  ring finger.  Adjustment made to back of ring finger on splint as well as in palm with moleskin as needed for comfort and fit.  Self-Care Pt engaged in self care for cleaning hand, digits webspaces etc. Handwashing used to introduce retrograde massage to decrease edema of digits for increased tissue extensibility and therefore increased ROM.  Pt shown how to perform task prior ROM activities. Instructions included: 1) Elevate the hand or prop it on a pillow. 2) Start at the fingertips and move towards the base of the hand, then onto the wrist and forearm. 3) Use a light, gentle pressure, as excessive pressure can compress lymphatic vessels and hinder the effectiveness of the massage. 4) Always massage in a downward direction, towards the heart, to help drain the swelling. 5) Repeat the massage several times a day.  TherEx Pt placed RUEUE in Fluidotherapy machine with supervised ROM x 10 min. Pt was educated to modified tendon gliding AROM during modality time to improve ROM and decrease pain/stiffness of affected extremity by use of the machine's massaging action and thermal properties. Pt then issued tendon gliding exercises/handout with review of motions to isolate DIP, PIP and MCP joints for straight finger position, hook (DIP/PIP flexion), fist (DIP/PIP/MCP flexion), taco/duck (MCP flexion only) and flat fist (MCP and PIP flexion). Exercises added to increase the circulation to the hand and wrist, reduce swelling and promote healthier soft tissue for increased motion as they are not intended to build hand or wrist strength at this time.   PATIENT EDUCATION: Education details: tendon gliding exercises Person educated: Patient Education method: Explanation, Demonstration, and Handouts Education comprehension: verbalized understanding, returned demonstration, and needs further education  HOME EXERCISE PROGRAM: 05/10/24 - initial HEP: in-splint digit 1-3 full AROM flex/ext, digit 4-5 PIP/DIP AROM  flex/ext (hook fist). Pt returned demo: Access Code: [email or web address] 05/19/24: Tendon Gliding Exercises   GOALS: Goals reviewed with patient? Yes  SHORT TERM GOALS: Target date: 06/11/24  Pt will return demo of affected UE HEP Baseline: initial ROM initiated at eval Goal status: IN Progress  2.  Pt will demo full composite flex ROM of affected UE pending precautions and pain threshold. Baseline: impaired digit 4-5 ROM at MCP, PIP, and DIP joints of  affected hand Goal status: IN Progress  3.  Pt will verbalize understanding of compensatory strategies and A/E options for handwriting and typing tasks as needed for participation in college classes.  Baseline: pt reported difficulty with handwriting and typing Goal status: INITIAL  4.  Pt will verbalize understanding of sleep positioning strategies and pain management strategies.  Baseline: sleep positioning handout and education provided at eval Goal status: INITIAL  5. Pt will verbalize and/or demo understanding of joint protection strategies and principles during functional tasks.  Baseline: New to outpt OT  Goal status: IN Progress  LONG TERM GOALS: Target date: 07/02/24  Patient will demonstrate at least 16% improvement with quick Dash score (reporting 45.4% disability or less) indicating improved functional use of affected extremity.  Baseline: QuickDASH: 61.4% deficit Goal status: INITIAL  2.  Patient will demo improved FM coordination as evidenced by completing nine-hole peg with use of RUE in 30 seconds or less pending precautions.  Baseline: TBD d/t current precautions Goal status: INITIAL  3.  Patient will demonstrate at least 30 lbs R UE grip strength as needed to open jars and other containers pending precautions.  Baseline: TBD d/t current precautions Goal status: INITIAL  4.  Pt will verbalize understanding of A/E, adaptive strategies, and joint protection strategies as needed for return-to-work consideration and completion  of college assignments.  Baseline: pt currently not working d/t injury, pt reported difficulty with typing and handwriting for college classes Goal status: INITIAL  ASSESSMENT:  CLINICAL IMPRESSION: Patient is a 33 y.o. female who was seen today for occupational therapy treatment s/p pin removal for closed displaced fracture of shaft of fifth metacarpal bone of right hand repair. Patient currently presents below baseline level of functioning demonstrating functional deficits and impairments but has some increased movement in hand today compared to eval. Pt would benefit from skilled OT services in the outpatient setting to work on impairments as noted below to help pt return to PLOF as able.     PERFORMANCE DEFICITS: in functional skills including ADLs, IADLs, coordination, dexterity, proprioception, sensation, edema, ROM, strength, pain, flexibility, Fine motor control, body mechanics, endurance, wound, and UE functional use, cognitive skills including none, and psychosocial skills including environmental adaptation.   IMPAIRMENTS: are limiting patient from ADLs, IADLs, rest and sleep, education, work, leisure, and social participation.   CO-MORBIDITIES: has no other co-morbidities that affects occupational performance. Patient will benefit from skilled OT to address above impairments and improve overall function.  REHAB POTENTIAL: Good  PLAN:  OT FREQUENCY: 2x/week  OT DURATION: 6 weeks  PLANNED INTERVENTIONS: 97168 OT Re-evaluation, 97535 self care/ADL training, 16109 therapeutic exercise, 97530 therapeutic activity, 97112 neuromuscular re-education, 97140 manual therapy, 97035 ultrasound, 97018 paraffin, 60454 fluidotherapy, 97010 moist heat, 97010 cryotherapy, 97032 electrical stimulation (manual), 97014 electrical stimulation unattended, 97760 Orthotic Initial, 97761 Prosthetic Initial, 97763 Orthotic/Prosthetic subsequent, manual lymph drainage, scar mobilization, passive range of  motion, functional mobility training, compression bandaging, energy conservation, patient/family education, and DME and/or AE instructions  RECOMMENDED OTHER SERVICES: N/A  CONSULTED AND AGREED WITH PLAN OF CARE: Patient  PLAN FOR NEXT SESSION:  Add additional ROM exercises per protocol and per Dr. [employer] instructions - attend to pain threshold Splint adjustment PRN Joint protection strategies during tasks Handwriting/typing compensation strategies as needed for college classes - per precautions  Zora Hires, OT 05/19/2024, 8:21 PM

## 2024-05-20 ENCOUNTER — Encounter (HOSPITAL_COMMUNITY): Payer: Self-pay | Admitting: Emergency Medicine

## 2024-05-20 ENCOUNTER — Ambulatory Visit (HOSPITAL_COMMUNITY)
Admission: EM | Admit: 2024-05-20 | Discharge: 2024-05-20 | Disposition: A | Discharge status: Home / Self Care | Payer: MEDICAID | Source: Home / Self Care | Attending: Family Medicine | Admitting: Family Medicine

## 2024-05-20 ENCOUNTER — Other Ambulatory Visit: Payer: Self-pay

## 2024-05-20 ENCOUNTER — Emergency Department (HOSPITAL_COMMUNITY)
Admission: EM | Admit: 2024-05-20 | Discharge: 2024-05-20 | Discharge status: Against Medical Advice | Payer: MEDICAID | Attending: Family Medicine | Admitting: Family Medicine

## 2024-05-20 DIAGNOSIS — Z202 Contact with and (suspected) exposure to infections with a predominantly sexual mode of transmission: Secondary | ICD-10-CM

## 2024-05-20 DIAGNOSIS — Z5321 Procedure and treatment not carried out due to patient leaving prior to being seen by health care provider: Secondary | ICD-10-CM | POA: Diagnosis not present

## 2024-05-20 DIAGNOSIS — T192XXA Foreign body in vulva and vagina, initial encounter: Secondary | ICD-10-CM | POA: Insufficient documentation

## 2024-05-20 DIAGNOSIS — X58XXXA Exposure to other specified factors, initial encounter: Secondary | ICD-10-CM | POA: Diagnosis not present

## 2024-05-20 LAB — HIV ANTIBODY (ROUTINE TESTING W REFLEX): HIV Screen 4th Generation wRfx: NONREACTIVE

## 2024-05-20 LAB — RPR: RPR Ser Ql: NONREACTIVE

## 2024-05-20 NOTE — Discharge Instructions (Signed)
 Staff will notify you if there is anything positive on the swab or on the blood work.

## 2024-05-20 NOTE — ED Triage Notes (Signed)
 Pt reports that believes has condom stuck in her vagina since 1-2 am this morning. Tried digging for it but unable to get it.

## 2024-05-20 NOTE — ED Triage Notes (Addendum)
 Pt c/o a condom stuck in her vagina. Pt states that she wants a glove and lube to attempt to remove condom herself.

## 2024-05-20 NOTE — ED Provider Notes (Signed)
 [facility]    CSN: [id number] Arrival date & time: 05/20/24  5284      History   Chief Complaint No chief complaint on file.   HPI Kathleen Hicks is a 33 y.o. female.   HPI Here for poss foreign body in her vagina. Cannot tell if anything is stuck. No dc or bleeding or abd pain.   Believes it has been there since intercourse about 8 hours ago.   Wishes to be screened for STD's, including bloodwork.   Past Medical History:  Diagnosis Date   Anxiety    Childhood asthma    Tobacco abuse     Patient Active Problem List   Diagnosis Date Noted   Acute pericarditis 12/17/2015   Tachycardia with heart rate 121-140 beats per minute 12/17/2015   Hyperglycemia 12/17/2015   Hyponatremia 12/17/2015   Hypochloremia 12/17/2015    Past Surgical History:  Procedure Laterality Date   NO PAST SURGERIES      OB History   No obstetric history on file.      Home Medications    Prior to Admission medications   Medication Sig Start Date End Date Taking? Authorizing Provider  Oxycodone  HCl 10 MG TABS Take 10 mg by mouth 4 (four) times daily. 05/04/24  Yes [provider]  ALPRAZolam (XANAX) 1 MG tablet Take 1 mg by mouth at bedtime as needed for anxiety.    [provider]    Family History Family History  Problem Relation Age of Onset   Other Mother        alive and well   Hypertension Mother    Hypertension Maternal Grandmother    Diabetes Maternal Grandmother    Kidney failure Maternal Grandmother        died in her 90's.   Healthy Father    Lupus Neg Hx     Social History Social History   Tobacco Use   Smoking status: Former    Current packs/day: 0.25    Average packs/day: 0.3 packs/day for 6.0 years (1.5 ttl pk-yrs)    Types: Cigarettes   Smokeless tobacco: Never  Vaping Use   Vaping status: Every Day  Substance Use Topics   Alcohol use: Yes    Alcohol/week: 0.0 standard drinks of alcohol    Comment: Occasional drink/social.    Drug use: Not Currently    Types: Marijuana    Comment: denies     Allergies   Patient has no known allergies.   Review of Systems Review of Systems   Physical Exam Triage Vital Signs ED Triage Vitals  Encounter Vitals Group     BP 05/20/24 0905 (!) 143/88     Systolic BP Percentile --      Diastolic BP Percentile --      Pulse Rate 05/20/24 0905 86     Resp 05/20/24 0905 16     Temp 05/20/24 0905 98.2 F (36.8 C)     Temp Source 05/20/24 0905 Oral     SpO2 05/20/24 0905 95 %     Weight --      Height --      Head Circumference --      Peak Flow --      Pain Score 05/20/24 0904 0     Pain Loc --      Pain Education --      Exclude from Growth Chart --    No data found.  Updated Vital Signs BP (!) 143/88 (BP Location:  Left Arm)   Pulse 86   Temp 98.2 F (36.8 C) (Oral)   Resp 16   LMP 04/22/2024 (Approximate)   SpO2 95%   Visual Acuity Right Eye Distance:   Left Eye Distance:   Bilateral Distance:    Right Eye Near:   Left Eye Near:    Bilateral Near:     Physical Exam Vitals reviewed.  Constitutional:      General: She is not in acute distress.    Appearance: She is not ill-appearing, toxic-appearing or diaphoretic.  HENT:     Mouth/Throat:     Mouth: Mucous membranes are moist.  Genitourinary:    Comments: Chaperone present during the time of exam. Nl external female. Vagina pink and rugated. Cx nl. Swab done at the time of speculum exam for STD's. Once the speculum is rotated, a condom is visualized, all balled up in the posterior vaginal fornix behind the cervix. Condom is removed with ring forceps without any complication Skin:    Coloration: Skin is not jaundiced or pale.  Neurological:     General: No focal deficit present.     Mental Status: She is alert and oriented to person, place, and time.  Psychiatric:        Behavior: Behavior normal.      UC Treatments / Results  Labs (all labs ordered are listed, but only abnormal  results are displayed) Labs Reviewed  HIV ANTIBODY (ROUTINE TESTING W REFLEX)  RPR  CERVICOVAGINAL ANCILLARY ONLY    EKG   Radiology No results found.  Procedures Procedures (including critical care time)  Medications Ordered in UC Medications - No data to display  Initial Impression / Assessment and Plan / UC Course  I have reviewed the triage vital signs and the nursing notes.  Pertinent labs & imaging results that were available during my care of the patient were reviewed by me and considered in my medical decision making (see chart for details).     Blood is drawn to check HIV and RPR. Staff will notify her of any positives on the bloodwork or on the swab. Final Clinical Impressions(s) / UC Diagnoses   Final diagnoses:  Foreign body in vagina, initial encounter  Potential exposure to STD     Discharge Instructions      Staff will notify you if there is anything positive on the swab or on the bloodwork.    ED Prescriptions   None    PDMP not reviewed this encounter.   Ann Keto, MD 05/20/24 [phone]

## 2024-05-20 NOTE — ED Notes (Signed)
 Pt called x3 in lobby, no response.

## 2024-05-21 ENCOUNTER — Ambulatory Visit: Payer: Self-pay

## 2024-05-21 LAB — CERVICOVAGINAL ANCILLARY ONLY
Chlamydia: NEGATIVE
Comment: NEGATIVE
Comment: NEGATIVE
Comment: NORMAL
Neisseria Gonorrhea: NEGATIVE
Trichomonas: NEGATIVE

## 2024-05-26 ENCOUNTER — Ambulatory Visit: Payer: MEDICAID | Attending: Orthopedic Surgery | Admitting: Occupational Therapy

## 2024-05-26 DIAGNOSIS — M79641 Pain in right hand: Secondary | ICD-10-CM | POA: Diagnosis present

## 2024-05-26 DIAGNOSIS — M6281 Muscle weakness (generalized): Secondary | ICD-10-CM

## 2024-05-26 DIAGNOSIS — R29898 Other symptoms and signs involving the musculoskeletal system: Secondary | ICD-10-CM | POA: Diagnosis present

## 2024-05-26 DIAGNOSIS — R278 Other lack of coordination: Secondary | ICD-10-CM | POA: Diagnosis present

## 2024-05-26 DIAGNOSIS — R208 Other disturbances of skin sensation: Secondary | ICD-10-CM

## 2024-05-26 NOTE — Therapy (Signed)
 OUTPATIENT OCCUPATIONAL THERAPY ORTHO TREATMENT  Patient Name: Kathleen Hicks MRN: [id number] DOB:[birth year], 33 y.o., female Today's Date: 05/26/2024  PCP: No PCP per pt REFERRING PROVIDER: Merrill Abide, MD   END OF SESSION:  OT End of Session - 05/26/24 1318     Visit Number 3    Number of Visits 13   including eval   Date for OT Re-Evaluation 07/02/24    Authorization Type [employer], VL: 27    Authorization Time Period Approved 12 OT visits 05/10/24 - 07/10/24 VL; 27    OT Start Time 1318    OT Stop Time 1400    OT Time Calculation (min) 42 min    Equipment Utilized During Treatment fluidotherapy    Activity Tolerance Patient tolerated treatment well    Behavior During Therapy WFL for tasks assessed/performed             Past Medical History:  Diagnosis Date   Anxiety    Childhood asthma    Tobacco abuse    Past Surgical History:  Procedure Laterality Date   NO PAST SURGERIES     Patient Active Problem List   Diagnosis Date Noted   Acute pericarditis 12/17/2015   Tachycardia with heart rate 121-140 beats per minute 12/17/2015   Hyperglycemia 12/17/2015   Hyponatremia 12/17/2015   Hypochloremia 12/17/2015    ONSET DATE: 04/22/2024 (referral date), approx. 03/27/24 (initial injury date), 04/08/24 (date of surgery - R small finger metacarpal shaft fx closed reduction and percutaneous pinning)  REFERRING DIAG: N56.213Y (ICD-10-CM) - Closed displaced fracture of shaft of fifth metacarpal bone of right hand, initial encounter   THERAPY DIAG:  Muscle weakness (generalized)  Other lack of coordination  Pain in right hand  Other disturbances of skin sensation  Other symptoms and signs involving the musculoskeletal system  Rationale for Evaluation and Treatment: Rehabilitation  SUBJECTIVE:   SUBJECTIVE STATEMENT: Pt's name pronounced: "Kathleen Hicks"  Pt reports things are going okay but some minor adjustments would be helpful for her splint ie) some  discomfort on back of ring finger from splint.  She asked about getting the hand wet and is reassured she can wash the hand.   Pt accompanied by: self + Significant other  PERTINENT HISTORY: Closed displaced fracture of shaft of fifth metacarpal bone of right hand, anxiety, tobacco abuse  03/29/24 ED Provider Notes: Mechanism of injury: pt "running out her back door and hit her hand on the door and has had increased pain and swelling since then"  Per 04/22/24 Dr Merlinda Starling Progress Note: "She is doing well overall, x-rays obtained today show stable appearance of the small finger metacarpal fracture with stable appearance of the pins. Pin sites are clean and dry on examination today. Rotation of the digits is appropriate. We will transition to a short arm ulnar gutter cast today for further protection for an additional 2 weeks. Pins will remain in place. She will return in approximate 2 weeks for repeat clinical and radiographic check, at that juncture we will likely remove the pins and transition to a removable splint, my hope is to begin range of motion protocol at that time as well"  Imaging: XR Hand Complete Right Result Date: 04/22/2024 X-rays of the right hand show stable appearance of the small finger metacarpal fracture status post pinning, pins remain well-fixed, fracture remains well reduced without evidence of interval displacement or hardware failure  PRECAUTIONS: Continue POC per [place]  Hand Protocol 5th Edition. Per 05/10/24 Dr Merlinda Starling Office Visit: "At  this juncture she will be transition to a removable orthosis, ulnar gutter with occupational therapy. She can begin range of motion exercises of the hand with both active and passive range of motion. Refrain from weightbearing initially. I will plan on seeing her back in 4 weeks for repeat clinical and radiographic check, at that juncture we will likely initiate weightbearing exercises."  WEIGHT BEARING RESTRICTIONS: Yes NWB RUE initially  PAIN:  Are  you having pain? Yes: NPRS scale: 4 (6 at worst)/10 Pain location: R MCP joint Pain description: aching/irritating Aggravating factors: holding hand down then moving hand to elevated Relieving factors: elevating hand, medication (for back 4x/day)  FALLS: Has patient fallen in last 6 months? No  LIVING ENVIRONMENT: Lives with: lives with their family Lives in: House/apartment Stairs: Yes: Internal: 10 steps; on left going up   PLOF: Independent with basic ADLs, Occupation: CNA work, Education officer, community (requires writing/typing for note-taking)  PATIENT GOALS: "to get my pinky movement back" and "be able to grab something without my fingers being stuck straight"  OBJECTIVE:  Note: Objective measures were completed at Evaluation unless otherwise noted.  HAND DOMINANCE: Right  ADLs: Overall ADLs: compensating with L hand Transfers/ambulation related to ADLs: Eating: ind with L hand Grooming: ind with L hand UB Dressing: some difficulty with long-sleeves, buttons, and zippers LB Dressing: ind, difficulty with tying shoes Toileting: ind Bathing: difficulty, using L hand to compensate  IADLs: Shopping: difficulty, compensating with L hand Light housekeeping: difficulty, family assists Meal Prep: difficulty, using pointer finger and middle finger carefully Community mobility: currently driving, no concerns  Handwriting: impaired, fair legibility though pt reported pain  Occupation: CNA (ADLs/caregiving role, 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  MOBILITY STATUS: Independent  POSTURE COMMENTS:  Ind sitting posture  ACTIVITY TOLERANCE: Activity tolerance: no change  FUNCTIONAL OUTCOME MEASURES: QuickDASH: 61.4% deficit    UPPER  EXTREMITY ROM:    Active ROM Right eval Left eval  Shoulder flexion [facility]  Shoulder abduction    Shoulder adduction    Shoulder extension    Shoulder internal rotation    Shoulder external rotation    Elbow flexion [facility] WFL  Elbow extension [facility] WFL  Wrist flexion [facility] WFL  Wrist extension WFL ("tender")   Wrist ulnar deviation    Wrist radial deviation    Wrist pronation WFL   Wrist supination WFL   (Blank rows = not tested)  Digit 1-3 of RUE: WFL Digit 4-5 of RUE: impaired at DIP, PIP, and MCP. Digit 5 limitations > digit 4 limitations.  UPPER EXTREMITY MMT:   Not tested per current precautions  MMT Right eval Left eval  Shoulder flexion    Shoulder abduction    Shoulder adduction    Shoulder extension    Shoulder internal rotation    Shoulder external rotation    Middle trapezius    Lower trapezius    Elbow flexion    Elbow extension    Wrist flexion    Wrist extension    Wrist ulnar deviation    Wrist radial deviation    Wrist pronation    Wrist supination    (Blank rows = not tested)  HAND FUNCTION: Not tested per current precautions  COORDINATION: Not tested per current precautions  SENSATION: Pt denied numbness/tingling of affected UE  EDEMA: mild at  affected wrist  MUSCLE TONE: RUE: Within functional limits and LUE: Within functional limits  COGNITION: Overall cognitive status: Within functional limits for tasks assessed   OBSERVATIONS: Pt was pleasant. Pt limited by pain and stiffness of affected hand. Pt had bandage over wound sites where pins were removed earlier today. Bandage had some blood from recent removal of pins though wounds were now clotted and no s/s of infection noted. Pt has long nails of B hands though reported intent to go to nail salon tomorrow to cut nails.   Next appointment with Dr. Merlinda Starling: 06/07/24                                                                                                                            TODAY'S TREATMENT:    TherEx Pt placed RUE in Fluidotherapy machine with supervised ROM x 10 min. Pt was educated to continue to progress tendon gliding AROM during modality time to improve ROM and decrease pain/stiffness of affected extremity by use of the machine's massaging action and thermal properties. Pt previously issued tendon gliding exercises with review of motions to isolate DIP, PIP and MCP joints for straight and bent/flexed finger positions for increased motion.  Pt also engaged in supported DIP/PIP flexion by holding the proximal phalanx to improve flexion of these individual joints as she is able to perform gentle AAROM/self passive ROM at this time per protocol.  TherAct Reviewed that orthosis may be left off while at home performing sedentary activities and light ADLs with practice moving a small ball in her hand as well as other coordination activities such as writing and handling small objects like dice to work on R UE finger ROM, dexterity and isolated movements with demonstration and practice to pick up objects like dice one at a time until patient got 5 in her hand and then rolled them for modified [employer].  Pt engaged in writing her name an score while holding a dice in the palm of her hand with her pinkie finger to facilitate flexion.   PATIENT EDUCATION: Education details: Coordination activities Person educated: Patient Education method: Explanation, Demonstration, and Handouts Education comprehension: verbalized understanding, returned demonstration, and needs further education  HOME EXERCISE PROGRAM: 05/10/24 - initial HEP: in-splint digit 1-3 full AROM flex/ext, digit 4-5 PIP/DIP AROM flex/ext (hook fist). Pt returned demo: Access Code: [email or web address] 05/19/24: Tendon Gliding Exercises   GOALS: Goals reviewed with patient? Yes  SHORT TERM GOALS: Target date: 06/11/24  Pt will return demo of affected UE HEP Baseline: initial ROM initiated at eval Goal status:  IN Progress  2.  Pt will demo full composite flex ROM of affected UE pending precautions and pain threshold. Baseline: impaired digit 4-5 ROM at MCP, PIP, and DIP joints of affected hand Goal status: IN Progress  3.  Pt will verbalize understanding of compensatory strategies and A/E options for handwriting and typing tasks as needed for participation in college classes.  Baseline: pt reported difficulty  with handwriting and typing Goal status: IN Progress  4.  Pt will verbalize understanding of sleep positioning strategies and pain management strategies.  Baseline: sleep positioning handout and education provided at eval Goal status: INITIAL  5. Pt will verbalize and/or demo understanding of joint protection strategies and principles during functional tasks.  Baseline: New to outpt OT  Goal status: IN Progress  LONG TERM GOALS: Target date: 07/02/24  Patient will demonstrate at least 16% improvement with quick Dash score (reporting 45.4% disability or less) indicating improved functional use of affected extremity.  Baseline: QuickDASH: 61.4% deficit Goal status: IN Progress  2.  Patient will demo improved FM coordination as evidenced by completing nine-hole peg with use of RUE in 30 seconds or less pending precautions.  Baseline: TBD d/t current precautions Goal status: IN Progress  3.  Patient will demonstrate at least 30 lbs R UE grip strength as needed to open jars and other containers pending precautions.  Baseline: TBD d/t current precautions Goal status: IN Progress  4.  Pt will verbalize understanding of A/E, adaptive strategies, and joint protection strategies as needed for return-to-work consideration and completion of college assignments.  Baseline: pt currently not working d/t injury, pt reported difficulty with typing and handwriting for college classes Goal status: IN Progress  ASSESSMENT:  CLINICAL IMPRESSION: Patient is a 33 y.o. female who was seen today for  occupational therapy treatment s/p pin removal for closed displaced fracture of shaft of fifth metacarpal bone of right hand repair. Patient currently presents below baseline level of functioning demonstrating functional deficits and impairments but has some increased movement in hand again today compared to eval and improved ease with some writing activities. Pt would benefit from skilled OT services in the outpatient setting to work on impairments as noted below to help pt return to PLOF as able.     PERFORMANCE DEFICITS: in functional skills including ADLs, IADLs, coordination, dexterity, proprioception, sensation, edema, ROM, strength, pain, flexibility, Fine motor control, body mechanics, endurance, wound, and UE functional use, cognitive skills including none, and psychosocial skills including environmental adaptation.   IMPAIRMENTS: are limiting patient from ADLs, IADLs, rest and sleep, education, work, leisure, and social participation.   CO-MORBIDITIES: has no other co-morbidities that affects occupational performance. Patient will benefit from skilled OT to address above impairments and improve overall function.  REHAB POTENTIAL: Good  PLAN:  OT FREQUENCY: 2x/week  OT DURATION: 6 weeks  PLANNED INTERVENTIONS: 97168 OT Re-evaluation, 97535 self care/ADL training, 96045 therapeutic exercise, 97530 therapeutic activity, 97112 neuromuscular re-education, 97140 manual therapy, 97035 ultrasound, 97018 paraffin, 40981 fluidotherapy, 97010 moist heat, 97010 cryotherapy, 97032 electrical stimulation (manual), 97014 electrical stimulation unattended, 97760 Orthotic Initial, 97761 Prosthetic Initial, 97763 Orthotic/Prosthetic subsequent, manual lymph drainage, scar mobilization, passive range of motion, functional mobility training, compression bandaging, energy conservation, patient/family education, and DME and/or AE instructions  RECOMMENDED OTHER SERVICES: N/A  CONSULTED AND AGREED WITH PLAN  OF CARE: Patient  PLAN FOR NEXT SESSION:  Review Sleep positions and Joint protection strategies during tasks Add additional ROM exercises per protocol and per Dr. [employer] instructions - attend to pain threshold PROM and strengthening Handwriting/typing compensation strategies as needed for college classes - per precautions Pt 7 weeks 05/27/24  Zora Hires, OT 05/26/2024, 5:07 PM

## 2024-06-01 ENCOUNTER — Ambulatory Visit: Payer: MEDICAID | Admitting: Occupational Therapy

## 2024-06-07 ENCOUNTER — Other Ambulatory Visit (INDEPENDENT_AMBULATORY_CARE_PROVIDER_SITE_OTHER): Payer: MEDICAID

## 2024-06-07 ENCOUNTER — Ambulatory Visit (INDEPENDENT_AMBULATORY_CARE_PROVIDER_SITE_OTHER): Payer: MEDICAID | Admitting: Orthopedic Surgery

## 2024-06-07 DIAGNOSIS — S62326D Displaced fracture of shaft of fifth metacarpal bone, right hand, subsequent encounter for fracture with routine healing: Secondary | ICD-10-CM

## 2024-06-07 NOTE — Progress Notes (Signed)
   Pennye Beeghly - 33 y.o. female MRN [id number]  Date of birth: [birth year]  Office Visit Note: Visit Date: 06/07/2024 PCP: Patient, No Pcp Per Referred by: No ref. provider found  Subjective:  HPI: Gratia Disla is a 33 y.o. female who presents today for follow up 8 weeks status post right small finger closed reduction and percutaneous pinning metacarpal shaft fracture.  She is doing well overall, is making excellent progress with occupational therapy from a range of motion standpoint.  Pain is well-controlled.  Pertinent ROS were reviewed with the patient and found to be negative unless otherwise specified above in HPI.   Assessment & Plan: Visit Diagnoses:  1. Closed displaced fracture of shaft of fifth metacarpal bone of right hand with routine healing, subsequent encounter     Plan: She continues to do very well postoperatively.  X-rays obtained today show stable appearance of the small finger metacarpal fracture with appropriate interval healing.  Continue with occupational therapy as instructed, progression to strengthening moving forward.  Plan to follow-up with her in approximate 4 weeks for clinical and radiographic recheck.  Follow-up: No follow-ups on file.   Meds & Orders: No orders of the defined types were placed in this encounter.   Orders Placed This Encounter  Procedures   XR Hand Complete Right     Procedures: No procedures performed       Objective:   Vital Signs: LMP 04/22/2024 (Approximate)   Ortho Exam Right hand: - No tenderness over the small finger metacarpal region - No rotation abnormality, normal cascade of the digits - Able to perform near composite fist with the right hand, PIP small finger slightly limited actively, improved passively, able to reach the distal palmar crease with all digits - Small finger MCP 0-90, PIP 0-80, DIP 0-45 - Grip strength Jamar 2 right 45, left 65  Imaging: XR Hand Complete Right Result Date: 06/07/2024 X-rays  demonstrate appropriate interval healing of the small finger metacarpal shaft fracture    Thaddeaus Monica Merlinda Starling) Levaughn Puccinelli, M.D. [facility], Hand Surgery

## 2024-06-08 ENCOUNTER — Ambulatory Visit: Payer: MEDICAID | Admitting: Occupational Therapy

## 2024-06-10 ENCOUNTER — Ambulatory Visit: Payer: MEDICAID | Admitting: Occupational Therapy

## 2024-06-10 ENCOUNTER — Telehealth: Payer: Self-pay | Admitting: Occupational Therapy

## 2024-06-10 NOTE — Telephone Encounter (Signed)
 This is to document my attempt to call patient after no-show for OT appt this PM.  This is patient's # 2nd missed appt this week as pt also did not come Tuesday.   Primary phone number(s) was used in efforts to contact the patient.   Spoke to patient to remind them of clinic attendance policy and upcoming therapy visit.  Pt confirmed she can see appts in My Chart, that she saw the doctor earlier this week and we can progress strengthening at this time.

## 2024-06-15 ENCOUNTER — Ambulatory Visit: Payer: MEDICAID | Admitting: Occupational Therapy

## 2024-06-15 DIAGNOSIS — R278 Other lack of coordination: Secondary | ICD-10-CM

## 2024-06-15 DIAGNOSIS — M6281 Muscle weakness (generalized): Secondary | ICD-10-CM | POA: Diagnosis not present

## 2024-06-15 DIAGNOSIS — M79641 Pain in right hand: Secondary | ICD-10-CM

## 2024-06-15 DIAGNOSIS — R29898 Other symptoms and signs involving the musculoskeletal system: Secondary | ICD-10-CM

## 2024-06-15 NOTE — Therapy (Signed)
 OUTPATIENT OCCUPATIONAL THERAPY ORTHO TREATMENT  Patient Name: Kathleen Hicks MRN: [id number] DOB:May 22, 1991, 33 y.o., female Today's Date: 06/15/2024  PCP: No PCP per pt REFERRING PROVIDER: Arlinda Buster, MD   END OF SESSION:  OT End of Session - 06/15/24 1230     Visit Number 4    Number of Visits 13   including eval   Date for OT Re-Evaluation 07/02/24    Authorization Type [employer], VL: 27    Authorization Time Period Approved 12 OT visits 05/10/24 - 07/10/24 VL; 27    OT Start Time 1231    OT Stop Time 1324    OT Time Calculation (min) 53 min    Equipment Utilized During Treatment Pink putty    Activity Tolerance Patient tolerated treatment well    Behavior During Therapy WFL for tasks assessed/performed          Past Medical History:  Diagnosis Date   Anxiety    Childhood asthma    Tobacco abuse    Past Surgical History:  Procedure Laterality Date   NO PAST SURGERIES     Patient Active Problem List   Diagnosis Date Noted   Acute pericarditis 12/17/2015   Tachycardia with heart rate 121-140 beats per minute 12/17/2015   Hyperglycemia 12/17/2015   Hyponatremia 12/17/2015   Hypochloremia 12/17/2015    ONSET DATE: 04/22/2024 (referral date), approx. 03/27/24 (initial injury date), 04/08/24 (date of surgery - R small finger metacarpal shaft fx closed reduction and percutaneous pinning)  REFERRING DIAG: D37.673J (ICD-10-CM) - Closed displaced fracture of shaft of fifth metacarpal bone of right hand, initial encounter   THERAPY DIAG:  Muscle weakness (generalized)  Other lack of coordination  Pain in right hand  Other symptoms and signs involving the musculoskeletal system  Rationale for Evaluation and Treatment: Rehabilitation  SUBJECTIVE:   SUBJECTIVE STATEMENT: Pt's name pronounced: Kathleen Hicks  Pt reports she went back to work Saturday.  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  Other aspects of life are also going well.  She has been working out and lifting a 15 lb dumbbell but while holding it with both 2 hands at same time.  Pt has not been wearing her splint at night due to the stiffness and not during the day since seeing the doctor on June 16th.  MD notes  She is doing well overall, is making excellent progress with occupational therapy from a range of motion standpoint. Pain is well-controlled. Continue with occupational therapy as instructed, progression to strengthening moving forward. Plan to follow-up with her in approximate 4 weeks for clinical and radiographic recheck.   Pt accompanied by: self  PERTINENT HISTORY: Closed displaced fracture of shaft of fifth metacarpal bone of right hand, anxiety, tobacco abuse  03/29/24 ED Provider Notes: Mechanism of injury: pt running out her back door and hit her hand on the door and has had increased pain and swelling since then  Per 04/22/24 Dr Erwin Progress Note: She is doing well overall, x-rays obtained today show stable appearance of the small finger metacarpal fracture with stable appearance of the pins. Pin sites are clean and dry on examination today. Rotation of the digits is appropriate. We will transition to a short arm ulnar gutter cast today for further protection for an additional 2 weeks. Pins will remain in  place. She will return in approximate 2 weeks for repeat clinical and radiographic check, at that juncture we will likely remove the pins and transition to a removable splint, my hope is to begin range of motion protocol at that time as well  Imaging: XR Hand Complete Right Result Date: 04/22/2024 X-rays of the right hand show stable appearance of the small finger metacarpal fracture status post pinning, pins remain well-fixed, fracture remains well reduced without evidence of interval displacement or hardware failure  PRECAUTIONS:  Continue POC per [place]  Hand Protocol 5th Edition. Per 05/10/24 Dr Erwin Office Visit: At this juncture she will be transition to a removable orthosis, ulnar gutter with occupational therapy. She can begin range of motion exercises of the hand with both active and passive range of motion. Refrain from weightbearing initially. I will plan on seeing her back in 4 weeks for repeat clinical and radiographic check, at that juncture we will likely initiate weightbearing exercises.  WEIGHT BEARING RESTRICTIONS: Yes NWB RUE initially  PAIN: NA upon arrival to therapy today. Are you having pain? Yes: NPRS scale: at worst in the past week 5/10 Pain location: R MCP joint Pain description: aching/irritating Aggravating factors: bumping it Relieving factors: elevating hand, medication (for back 4x/day)  FALLS: Has patient fallen in last 6 months? No  LIVING ENVIRONMENT: Lives with: lives with their family Lives in: House/apartment Stairs: Yes: Internal: 10 steps; on left going up   PLOF: Independent with basic ADLs, Occupation: CNA work, Education officer, community (requires writing/typing for note-taking)  PATIENT GOALS: to get my pinky movement back and be able to grab something without my fingers being stuck straight  OBJECTIVE:  Note: Objective measures were completed at Evaluation unless otherwise noted.  HAND DOMINANCE: Right  ADLs: Overall ADLs: compensating with L hand Transfers/ambulation related to ADLs: Eating: ind with L hand Grooming: ind with L hand UB Dressing: some difficulty with long-sleeves, buttons, and zippers LB Dressing: ind, difficulty with tying shoes Toileting: ind Bathing: difficulty, using L hand to compensate  IADLs: Shopping: difficulty, compensating with L hand Light housekeeping: difficulty, family assists Meal Prep: difficulty, using pointer finger and middle finger carefully Community mobility: currently driving, no concerns  Handwriting:  impaired, fair legibility though pt reported pain  Occupation: CNA (ADLs/caregiving role, 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  MOBILITY STATUS: Independent  POSTURE COMMENTS:  Ind sitting posture  ACTIVITY TOLERANCE: Activity tolerance: no change  FUNCTIONAL OUTCOME MEASURES: QuickDASH: 61.4% deficit    UPPER EXTREMITY ROM:    Active ROM Right eval Left eval  Shoulder flexion [facility]  Shoulder abduction    Shoulder adduction    Shoulder extension    Shoulder internal rotation    Shoulder external rotation    Elbow flexion [facility] WFL  Elbow extension [facility] WFL  Wrist flexion [facility] WFL  Wrist extension WFL (tender)   Wrist ulnar deviation    Wrist radial deviation    Wrist pronation WFL   Wrist supination WFL   (Blank rows = not tested)  Digit 1-3 of RUE: WFL Digit 4-5 of RUE: impaired at DIP, PIP, and MCP. Digit 5 limitations > digit 4 limitations.  UPPER EXTREMITY MMT:   Not tested per current precautions  MMT Right eval Left eval  Shoulder flexion    Shoulder abduction    Shoulder adduction  Shoulder extension    Shoulder internal rotation    Shoulder external rotation    Middle trapezius    Lower trapezius    Elbow flexion    Elbow extension    Wrist flexion    Wrist extension    Wrist ulnar deviation    Wrist radial deviation    Wrist pronation    Wrist supination    (Blank rows = not tested)  HAND FUNCTION: 06/07/24 @ MD Right 45, Left 65 06/15/24 Right 53.7, 57.0, 51.1 Average 53.9 lbs Left 56.2, 57.7, 62.1 Average 58.7 lbs COORDINATION: Not tested  SENSATION: Pt denied numbness/tingling of affected UE  EDEMA: mild at affected wrist  MUSCLE TONE: RUE: Within functional limits and LUE: Within functional  limits  COGNITION: Overall cognitive status: Within functional limits for tasks assessed   OBSERVATIONS: Pt was pleasant. Pt limited by pain and stiffness of affected hand. Pt had bandage over wound sites where pins were removed earlier today. Bandage had some blood from recent removal of pins though wounds were now clotted and no s/s of infection noted. Pt has long nails of B hands though reported intent to go to nail salon tomorrow to cut nails.   Next appointment with Dr. Erwin: 07/05/24                                                                                                                           TODAY'S TREATMENT:    Orthotic As the goal is to discontinue splint by 10-12 weeks and pt will be 10 weeks in 2 days and she has not been wearing it recently, permission granted to remove orthosis but still gradually increase activities.  As protocol suggests a custom-fabricated circumferential palmar bar, with transverse arch support, to help reduce pain and free the uninvolved hand with exercise and activity, OT trialled a neoprene palmar wrap and this provided enough support on her ulnar hand/metacarpal that pt wanted to start with this option before a hard bar was trialled.   TherAct Initiated Putty Exercises with pink putty to begin strengthening, coordination and ROM of R hand/pinkie.  Patient provided visual demonstration, verbal and tactile cues as needed to improve performance of the various exercises/activities including:   - Putty Squeezes, composite flexion with putty, claw fist with putty  and lumbricals with putty - These 4 tasks were given as succession of strengthening tasks from simplest to more difficult such as in tendon glide motions   - Putty Rolls - encourage to roll putty into logs with extension of pinkie finger  - Finger Extension and Abduction with Putty - pt shown how to work on task with all fingers and thumb as well as individual fingers in opposition to thumb  and then opening fingers away from each other  - Finger Adduction with Putty - pt shown how to work on weaving putty between fingers/thumb and then squeeze fingers together while laying hand flat on table top.  - Thumb opposition  with Putty - pt shown how to oppose individual fingers to thumb with putty with help to round her fingers rather than pressing flat all the time  OT educated patient on theraputty recommendations: avoid hot environments, place in designated container, avoid contact with fabrics. Patient verbalized understanding.    Patient benefited from extra time, verbal/tactile cues, and modeling of task to allow time for processing of verbal instructions and improve motor planning of unfamiliar movements.   Self Care  Education provided re: primary precaution of limiting activities with a tight, sustained grip against resistance for up to 12 weeks. Pt engaged in writing activities with improved grasp and ease of holding drawing utensil.   PATIENT EDUCATION: Education details: Putty activities Person educated: Patient Education method: Programmer, multimedia, Facilities manager, Verbal cues, and Handouts Education comprehension: verbalized understanding, returned demonstration, and needs further education  HOME EXERCISE PROGRAM: 05/10/24 - initial HEP: in-splint digit 1-3 full AROM flex/ext, digit 4-5 PIP/DIP AROM flex/ext (hook fist). Pt returned demo: Access Code: [email or web address] 05/19/24: Tendon Gliding Exercises 06/15/24: Putty Activities Access Code: [email or web address]    GOALS: Goals reviewed with patient? Yes  SHORT TERM GOALS: Target date: 06/11/24  Pt will return demo of affected UE HEP Baseline: initial ROM initiated at eval Goal status: MET  2.  Pt will demo full composite flex ROM of affected UE pending precautions and pain threshold. Baseline: impaired digit 4-5 ROM at MCP, PIP, and DIP joints of affected hand Goal status: IN Progress 06/15/24: Nearly full composite flexion  3.  Pt will  verbalize understanding of compensatory strategies and A/E options for handwriting and typing tasks as needed for participation in college classes.  Baseline: pt reported difficulty with handwriting and typing Goal status: MET  4.  Pt will verbalize understanding of sleep positioning strategies and pain management strategies.  Baseline: sleep positioning handout and education provided at eval Goal status: NA - no further issues upon removal of splint  5. Pt will verbalize and/or demo understanding of joint protection strategies and principles during functional tasks.  Baseline: New to outpt OT  Goal status: IN Progress  LONG TERM GOALS: Target date: 07/02/24  Patient will demonstrate at least 16% improvement with quick Dash score (reporting 45.4% disability or less) indicating improved functional use of affected extremity.  Baseline: QuickDASH: 61.4% deficit Goal status: IN Progress  2.  Patient will demo improved FM coordination as evidenced by completing nine-hole peg with use of RUE in 30 seconds or less pending precautions.  Baseline: TBD d/t current precautions Goal status: IN Progress   3.  Patient will demonstrate at least 30 lbs R UE grip strength as needed to open jars and other containers pending precautions.  Baseline: TBD d/t current precautions Goal status: IN Progress 06/15/24: 53.9 lbs  4.  Pt will verbalize understanding of A/E, adaptive strategies, and joint protection strategies as needed for return-to-work consideration and completion of college assignments.  Baseline: pt currently not working d/t injury, pt reported difficulty with typing and handwriting for college classes Goal status: IN Progress  ASSESSMENT:  CLINICAL IMPRESSION: Patient is a 33 y.o. female who was seen today for occupational therapy treatment s/p pin removal for closed displaced fracture of shaft of fifth metacarpal bone of right hand repair. Patient currently presents returning to baseline  level of function with nearly full composite flexion, improved grip strength and able to function without her splint.  Putty activities introduced today with additional appt made to assess progress/comfort with strengthening tasks. Pt will benefit from  skilled OT services to ensure pt returned to PLOF without increased pain.     PERFORMANCE DEFICITS: in functional skills including ADLs, IADLs, coordination, dexterity, proprioception, sensation, edema, ROM, strength, pain, flexibility, Fine motor control, body mechanics, endurance, wound, and UE functional use, cognitive skills including none, and psychosocial skills including environmental adaptation.   IMPAIRMENTS: are limiting patient from ADLs, IADLs, rest and sleep, education, work, leisure, and social participation.   CO-MORBIDITIES: has no other co-morbidities that affects occupational performance. Patient will benefit from skilled OT to address above impairments and improve overall function.  REHAB POTENTIAL: Good  PLAN:  OT FREQUENCY: 2x/week  OT DURATION: 6 weeks  PLANNED INTERVENTIONS: 97168 OT Re-evaluation, 97535 self care/ADL training, 02889 therapeutic exercise, 97530 therapeutic activity, 97112 neuromuscular re-education, 97140 manual therapy, 97035 ultrasound, 97018 paraffin, 02960 fluidotherapy, 97010 moist heat, 97010 cryotherapy, 97032 electrical stimulation (manual), 97014 electrical stimulation unattended, 97760 Orthotic Initial, 97761 Prosthetic Initial, 97763 Orthotic/Prosthetic subsequent, manual lymph drainage, scar mobilization, passive range of motion, functional mobility training, compression bandaging, energy conservation, patient/family education, and DME and/or AE instructions  RECOMMENDED OTHER SERVICES: N/A  CONSULTED AND AGREED WITH PLAN OF CARE: Patient  PLAN FOR NEXT SESSION:   Decrease frequency to 1x/week today  DC visit Check strengthening, ROM and QuickDash Typing compensation strategies as needed  for college classes - per precautions  Patient will be 10 weeks s/p surgery 06/17/24  Clarita LITTIE Pride, OT 06/15/2024, 1:41 PM

## 2024-06-15 NOTE — Patient Instructions (Signed)
 Access Code: [email or web address] URL: [email or web address] Date: 06/15/2024 Prepared by: Clarita Pride  Exercises - Putty Squeezes  - 1 x daily - 10 reps - Seated Finger Composite Flexion with Putty  - 1 x daily - 10 reps - Seated Claw Fist with Putty  - 1 x daily - 10 reps - Rolling Putty on Table  - 1 x daily - 10 reps - Thumb Opposition with Putty  - 1 x daily - 10 reps - Finger Lumbricals with Putty  - 1 x daily - 10 reps - Finger Extension with Putty  - 1 x daily - 10 reps - Finger Abduction with Putty  - 1 x daily - 10 reps - Finger Adduction with Putty  - 1 x daily - 10 reps

## 2024-06-17 ENCOUNTER — Ambulatory Visit: Payer: MEDICAID | Admitting: Occupational Therapy

## 2024-06-22 ENCOUNTER — Ambulatory Visit: Payer: MEDICAID | Admitting: Occupational Therapy

## 2024-06-24 ENCOUNTER — Encounter: Payer: MEDICAID | Admitting: Occupational Therapy

## 2024-07-05 ENCOUNTER — Encounter: Payer: MEDICAID | Admitting: Orthopedic Surgery

## 2024-10-25 ENCOUNTER — Encounter: Payer: Self-pay | Admitting: Radiology
